# Patient Record
Sex: Male | Born: 1985 | Race: White | Hispanic: No | Marital: Married | Smoking: Current every day smoker
Health system: Southern US, Community
[De-identification: ages and names within clinical notes are randomized; demographics above are authoritative.]

## PROBLEM LIST (undated history)

## (undated) DIAGNOSIS — F419 Anxiety disorder, unspecified: Secondary | ICD-10-CM

## (undated) DIAGNOSIS — F429 Obsessive-compulsive disorder, unspecified: Secondary | ICD-10-CM

## (undated) HISTORY — PX: BACK SURGERY: SHX140

## (undated) HISTORY — DX: Obsessive-compulsive disorder, unspecified: F42.9

## (undated) HISTORY — DX: Anxiety disorder, unspecified: F41.9

---
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
## 2021-09-27 ENCOUNTER — Encounter: Payer: Self-pay | Admitting: Family Medicine

## 2021-09-27 ENCOUNTER — Other Ambulatory Visit: Payer: Self-pay

## 2021-09-27 ENCOUNTER — Ambulatory Visit (INDEPENDENT_AMBULATORY_CARE_PROVIDER_SITE_OTHER): Admitting: Family Medicine

## 2021-09-27 VITALS — BP 120/73 | HR 90 | Temp 99.1°F | Ht 67.0 in | Wt 273.0 lb

## 2021-09-27 DIAGNOSIS — Z23 Encounter for immunization: Secondary | ICD-10-CM | POA: Diagnosis not present

## 2021-09-27 DIAGNOSIS — F17200 Nicotine dependence, unspecified, uncomplicated: Secondary | ICD-10-CM | POA: Diagnosis not present

## 2021-09-27 DIAGNOSIS — Z0000 Encounter for general adult medical examination without abnormal findings: Secondary | ICD-10-CM

## 2021-09-27 DIAGNOSIS — G4452 New daily persistent headache (NDPH): Secondary | ICD-10-CM | POA: Diagnosis not present

## 2021-09-27 DIAGNOSIS — Z0001 Encounter for general adult medical examination with abnormal findings: Secondary | ICD-10-CM

## 2021-09-27 DIAGNOSIS — Z716 Tobacco abuse counseling: Secondary | ICD-10-CM | POA: Diagnosis not present

## 2021-09-27 LAB — URINALYSIS
Bilirubin, UA: NEGATIVE
Glucose, UA: NEGATIVE
Ketones, UA: NEGATIVE
Leukocytes,UA: NEGATIVE
Nitrite, UA: NEGATIVE
RBC, UA: NEGATIVE
Specific Gravity, UA: 1.02 (ref 1.005–1.030)
Urobilinogen, Ur: 1 mg/dL (ref 0.2–1.0)
pH, UA: 7 (ref 5.0–7.5)

## 2021-09-27 MED ORDER — TOPIRAMATE 25 MG PO TABS: ORAL_TABLET | ORAL | 0 refills | Status: DC
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
## 2021-09-28 LAB — CMP14+EGFR
ALT: 69 IU/L — ABNORMAL HIGH (ref 0–44)
AST: 37 IU/L (ref 0–40)
Albumin/Globulin Ratio: 1.8 (ref 1.2–2.2)
Albumin: 4.7 g/dL (ref 4.0–5.0)
Alkaline Phosphatase: 81 IU/L (ref 44–121)
BUN/Creatinine Ratio: 11 (ref 9–20)
BUN: 13 mg/dL (ref 6–20)
Bilirubin Total: 0.4 mg/dL (ref 0.0–1.2)
CO2: 23 mmol/L (ref 20–29)
Calcium: 9.6 mg/dL (ref 8.7–10.2)
Chloride: 105 mmol/L (ref 96–106)
Creatinine, Ser: 1.19 mg/dL (ref 0.76–1.27)
Globulin, Total: 2.6 g/dL (ref 1.5–4.5)
Glucose: 93 mg/dL (ref 70–99)
Potassium: 4.8 mmol/L (ref 3.5–5.2)
Sodium: 141 mmol/L (ref 134–144)
Total Protein: 7.3 g/dL (ref 6.0–8.5)
eGFR: 83 mL/min/{1.73_m2} (ref >59)

## 2021-09-28 LAB — CBC WITH DIFFERENTIAL/PLATELET
Basophils Absolute: 0.1 10*3/uL (ref 0.0–0.2)
Basos: 1 %
EOS (ABSOLUTE): 0.2 10*3/uL (ref 0.0–0.4)
Eos: 2 %
Hematocrit: 40.8 % (ref 37.5–51.0)
Hemoglobin: 13.7 g/dL (ref 13.0–17.7)
Immature Grans (Abs): 0 10*3/uL (ref 0.0–0.1)
Immature Granulocytes: 0 %
Lymphocytes Absolute: 3.5 10*3/uL — ABNORMAL HIGH (ref 0.7–3.1)
Lymphs: 37 %
MCH: 28.7 pg (ref 26.6–33.0)
MCHC: 33.6 g/dL (ref 31.5–35.7)
MCV: 86 fL (ref 79–97)
Monocytes Absolute: 0.7 10*3/uL (ref 0.1–0.9)
Monocytes: 7 %
Neutrophils Absolute: 5 10*3/uL (ref 1.4–7.0)
Neutrophils: 53 %
Platelets: 326 10*3/uL (ref 150–450)
RBC: 4.77 x10E6/uL (ref 4.14–5.80)
RDW: 12.9 % (ref 11.6–15.4)
WBC: 9.5 10*3/uL (ref 3.4–10.8)

## 2021-09-28 LAB — LIPID PANEL
Chol/HDL Ratio: 6.6 ratio — ABNORMAL HIGH (ref 0.0–5.0)
Cholesterol, Total: 210 mg/dL — ABNORMAL HIGH (ref 100–199)
HDL: 32 mg/dL — ABNORMAL LOW (ref >39)
LDL Chol Calc (NIH): 142 mg/dL — ABNORMAL HIGH (ref 0–99)
Triglycerides: 200 mg/dL — ABNORMAL HIGH (ref 0–149)
VLDL Cholesterol Cal: 36 mg/dL (ref 5–40)

## 2021-09-28 LAB — VITAMIN D 25 HYDROXY (VIT D DEFICIENCY, FRACTURES): Vit D, 25-Hydroxy: 21.6 ng/mL — ABNORMAL LOW (ref 30.0–100.0)

## 2021-11-01 ENCOUNTER — Encounter: Payer: Self-pay | Admitting: Family Medicine

## 2021-11-01 ENCOUNTER — Ambulatory Visit (INDEPENDENT_AMBULATORY_CARE_PROVIDER_SITE_OTHER): Admitting: Family Medicine

## 2021-11-01 VITALS — HR 83 | Temp 97.9°F | Ht 67.0 in | Wt 268.6 lb

## 2021-11-01 DIAGNOSIS — G43711 Chronic migraine without aura, intractable, with status migrainosus: Secondary | ICD-10-CM | POA: Diagnosis not present

## 2021-11-01 DIAGNOSIS — F411 Generalized anxiety disorder: Secondary | ICD-10-CM

## 2021-11-01 MED ORDER — TOPIRAMATE 100 MG PO TABS: 100.0000 mg | ORAL_TABLET | Freq: Every day | ORAL | 3 refills | Status: DC

## 2021-11-01 MED ORDER — HYDROXYZINE PAMOATE 25 MG PO CAPS: 25.0000 mg | ORAL_CAPSULE | Freq: Four times a day (QID) | ORAL | 3 refills | Status: DC | PRN

## 2021-11-01 NOTE — Progress Notes (Signed)
Subjective:  Patient ID: Tanner Johnson, male    DOB: July 24, 1986  Age: 35 y.o. MRN: [id number]  CC: Follow-up   HPI recheck of Tanner Johnson presents for recheck of his migraine.  He is having fewer now.  Only once since titrating the topiramate.  He is now taking it for a day.  He is not having side effects that he is noticed.  Particularly no dizzinessOr drowsiness. Patient also reports a moderate amount of anxiety.  This comes in discrete attacks.  He did like to have something that he does not have to take every day for a trial first before taking a daily medicine.  Anxiety score on GAD-7 noted below and reviewed with the patient.   Depression screen [facility] 2/9 11/01/2021 11/01/2021 09/27/2021  Decreased Interest 1 0 1  Down, Depressed, Hopeless 1 0 0  PHQ - 2 Score 2 0 1  Altered sleeping 1 - 0  Tired, decreased energy 1 - 1  Change in appetite 1 - 0  Feeling bad or failure about yourself  0 - 0  Trouble concentrating 0 - 0  Moving slowly or fidgety/restless 1 - 1  Suicidal thoughts 0 - 0  PHQ-9 Score 6 - 3  Difficult doing work/chores Somewhat difficult - Somewhat difficult   GAD 7 : Generalized Anxiety Score 11/01/2021 09/27/2021  Nervous, Anxious, on Edge 2 3  Control/stop worrying 2 1  Worry too much - different things 2 3  Trouble relaxing 2 3  Restless 2 1  Easily annoyed or irritable 2 3  Afraid - awful might happen 1 1  Total GAD 7 Score 13 15  Anxiety Difficulty Somewhat difficult Somewhat difficult      History Tanner Johnson has a past medical history of Anxiety and OCD (obsessive compulsive disorder).   He has a past surgical history that includes Back surgery.   His family history includes Hyperlipidemia in his father; Hypertension in his father.He reports that he has been smoking cigarettes. He has a 7.50 pack-year smoking history. He does not have any smokeless tobacco history on file. He reports that he does not currently use alcohol. He reports that he does not  use drugs.    ROS Review of Systems  Constitutional:  Negative for fever.  Respiratory:  Negative for shortness of breath.   Cardiovascular:  Negative for chest pain.  Musculoskeletal:  Negative for arthralgias.  Skin:  Negative for rash.   Objective:  Pulse 83    Temp 97.9 F (36.6 C)    Ht 5\' 7"  (1.702 m)    Wt 268 lb 9.6 oz (121.8 kg)    SpO2 98%    BMI 42.07 kg/m   BP Readings from Last 3 Encounters:  09/27/21 120/73    Wt Readings from Last 3 Encounters:  11/01/21 268 lb 9.6 oz (121.8 kg)  09/27/21 273 lb (123.8 kg)     Physical Exam Vitals reviewed.  Constitutional:      Appearance: He is well-developed.  HENT:     Head: Normocephalic and atraumatic.     Right Ear: External ear normal.     Left Ear: External ear normal.     Mouth/Throat:     Pharynx: No oropharyngeal exudate or posterior oropharyngeal erythema.  Eyes:     Pupils: Pupils are equal, round, and reactive to light.  Cardiovascular:     Rate and Rhythm: Normal rate and regular rhythm.     Heart sounds: No murmur heard. Pulmonary:  Effort: No respiratory distress.     Breath sounds: Normal breath sounds.  Musculoskeletal:     Cervical back: Normal range of motion and neck supple.  Neurological:     Mental Status: He is alert and oriented to person, place, and time.      Assessment & Plan:   Tanner Johnson was seen today for follow-up.  Diagnoses and all orders for this visit:  GAD (generalized anxiety disorder)  Intractable chronic migraine without aura and with status migrainosus  Other orders -     Discontinue: topiramate (TOPAMAX) 100 MG tablet; Take 1 tablet (100 mg total) by mouth at bedtime. 1 nightly times 1 week.  Then 2 nightly for 1 week.  Then 3 nightly for 1 week.  Then 4 nightly. -     hydrOXYzine (VISTARIL) 25 MG capsule; Take 1 capsule (25 mg total) by mouth every 6 (six) hours as needed for anxiety. -     topiramate (TOPAMAX) 100 MG tablet; Take 1 tablet (100 mg total) by  mouth at bedtime.      I have discontinued Tanner Johnson. [name]'s topiramate. I have also changed his topiramate. Additionally, I am having him start on hydrOXYzine.  Allergies as of 11/01/2021       Reactions   Sulfa Antibiotics Hives        Medication List        Accurate as of November 01, 2021  5:45 PM. If you have any questions, ask your nurse or doctor.          hydrOXYzine 25 MG capsule Commonly known as: VISTARIL Take 1 capsule (25 mg total) by mouth every 6 (six) hours as needed for anxiety. Started by: Mechele Claude, MD   topiramate 100 MG tablet Commonly known as: TOPAMAX Take 1 tablet (100 mg total) by mouth at bedtime. What changed:  medication strength how much to take how to take this when to take this additional instructions Changed by: Mechele Claude, MD         Follow-up: Return in about 6 months (around 05/02/2022).  Mechele Claude, M.D.

## 2021-11-09 ENCOUNTER — Other Ambulatory Visit: Payer: Self-pay

## 2021-11-09 ENCOUNTER — Ambulatory Visit (HOSPITAL_COMMUNITY)
Admission: RE | Admit: 2021-11-09 | Discharge: 2021-11-09 | Disposition: A | Discharge status: Home / Self Care | Source: Ambulatory Visit | Attending: Family Medicine | Admitting: Family Medicine

## 2021-11-09 DIAGNOSIS — G4452 New daily persistent headache (NDPH): Secondary | ICD-10-CM | POA: Insufficient documentation

## 2021-11-15 ENCOUNTER — Other Ambulatory Visit: Payer: Self-pay | Admitting: Family Medicine

## 2021-11-15 DIAGNOSIS — E236 Other disorders of pituitary gland: Secondary | ICD-10-CM

## 2022-01-15 ENCOUNTER — Encounter: Payer: Self-pay | Admitting: Neurology

## 2022-01-15 ENCOUNTER — Ambulatory Visit (INDEPENDENT_AMBULATORY_CARE_PROVIDER_SITE_OTHER): Admitting: Neurology

## 2022-01-15 VITALS — BP 136/86 | HR 80 | Ht 67.0 in | Wt 264.0 lb

## 2022-01-15 DIAGNOSIS — R209 Unspecified disturbances of skin sensation: Secondary | ICD-10-CM | POA: Insufficient documentation

## 2022-01-15 DIAGNOSIS — G4700 Insomnia, unspecified: Secondary | ICD-10-CM | POA: Insufficient documentation

## 2022-01-15 DIAGNOSIS — G4733 Obstructive sleep apnea (adult) (pediatric): Secondary | ICD-10-CM | POA: Diagnosis not present

## 2022-01-15 DIAGNOSIS — R519 Headache, unspecified: Secondary | ICD-10-CM | POA: Insufficient documentation

## 2022-01-15 DIAGNOSIS — F17200 Nicotine dependence, unspecified, uncomplicated: Secondary | ICD-10-CM | POA: Insufficient documentation

## 2022-01-15 DIAGNOSIS — R030 Elevated blood-pressure reading, without diagnosis of hypertension: Secondary | ICD-10-CM | POA: Insufficient documentation

## 2022-01-15 DIAGNOSIS — H5210 Myopia, unspecified eye: Secondary | ICD-10-CM | POA: Insufficient documentation

## 2022-01-15 DIAGNOSIS — M4310 Spondylolisthesis, site unspecified: Secondary | ICD-10-CM | POA: Insufficient documentation

## 2022-01-15 DIAGNOSIS — M5137 Other intervertebral disc degeneration, lumbosacral region: Secondary | ICD-10-CM | POA: Insufficient documentation

## 2022-01-15 DIAGNOSIS — M5416 Radiculopathy, lumbar region: Secondary | ICD-10-CM | POA: Insufficient documentation

## 2022-01-15 DIAGNOSIS — R410 Disorientation, unspecified: Secondary | ICD-10-CM | POA: Diagnosis not present

## 2022-01-15 DIAGNOSIS — M549 Dorsalgia, unspecified: Secondary | ICD-10-CM | POA: Insufficient documentation

## 2022-01-15 DIAGNOSIS — M51379 Other intervertebral disc degeneration, lumbosacral region without mention of lumbar back pain or lower extremity pain: Secondary | ICD-10-CM | POA: Insufficient documentation

## 2022-01-15 DIAGNOSIS — M999 Biomechanical lesion, unspecified: Secondary | ICD-10-CM | POA: Insufficient documentation

## 2022-01-15 DIAGNOSIS — J329 Chronic sinusitis, unspecified: Secondary | ICD-10-CM | POA: Insufficient documentation

## 2022-01-15 DIAGNOSIS — F3289 Other specified depressive episodes: Secondary | ICD-10-CM | POA: Insufficient documentation

## 2022-01-15 DIAGNOSIS — M25529 Pain in unspecified elbow: Secondary | ICD-10-CM | POA: Insufficient documentation

## 2022-01-15 DIAGNOSIS — M5126 Other intervertebral disc displacement, lumbar region: Secondary | ICD-10-CM | POA: Insufficient documentation

## 2022-01-15 DIAGNOSIS — F4320 Adjustment disorder, unspecified: Secondary | ICD-10-CM | POA: Insufficient documentation

## 2022-01-15 NOTE — Progress Notes (Signed)
Chief Complaint  Patient presents with   New Patient (Initial Visit)    Rm 15. Alone. NP internal referral for Empty sella turcica. Pt reports during the past month around 7PM he suffers from extreme exhaustion and has to take a nap, if he does not he will be fully awake until 3AM. Would like to know if it is related to pituitary issues.      ASSESSMENT AND PLAN  Tanner Johnson is a 36 y.o. male   Chronic migraine headaches At risk for obstructive sleep apnea  Empty sella on CT of head, not MRI candidate due to spinal cord stimulator.  Pituitary Hormone test  Refer to sleep study  Emphasized importance of weight loss, healthy sleep habit  Aleve as needed for headache     DIAGNOSTIC DATA (LABS, IMAGING, TESTING) - I reviewed patient records, labs, notes, testing and imaging myself where available.   MEDICAL HISTORY:  Tanner Johnson, is a 59 -year-old male seen in request by his primary care doctor Tanner Johnson, for evaluation of extreme exhaustion, sleepiness, initial evaluation was on January 15, 2021  I reviewed and summarized the referring note. Obesity,  Lumbar decompression surgery.  He used to serve in [employer], including heavy lifting, had right lumbar radiculopathy, decompression surgery did help him, but continued to have intermittent low back pain, radiating pain to right lower extremity, he used to take regular high dose of Percocet 10/325 mg 4 times a day, now was able to successfully wean off narcotics,  He has frequent occipital area headaches, seems to more frequent at morning time when he first woke up, he was put on Topamax 100 mg at nighttime, has helped his headache  He works as an Merchandiser, retail, sedentary lifestyle, sometimes spending long hours tried resolving a problem, he reported a history of obsessive-compulsive disorder, if he put his mind on something, he tends to spend hours on the task, sometimes go to bed 2 to 3 AM, has to get up at 7 AM,  over by the afternoon, he felt exhausted," Sickly tired", he has to take a nap to re power himself,  He does has obesity, snoring occasionally  PHYSICAL EXAM:   Vitals:   01/15/22 0806  BP: 136/86  Pulse: 80  Weight: 264 lb (119.7 kg)  Height: 5\' 7"  (1.702 m)   Not recorded     Body mass index is 41.35 kg/m.  PHYSICAL EXAMNIATION:  Gen: NAD, conversant, well nourised, well groomed                     Cardiovascular: Regular rate rhythm, no peripheral edema, warm, nontender. Eyes: Conjunctivae clear without exudates or hemorrhage Neck: Supple, no carotid bruits. Pulmonary: Clear to auscultation bilaterally   NEUROLOGICAL EXAM:  MENTAL STATUS: obese Speech:    Speech is normal; fluent and spontaneous with normal comprehension.  Cognition:     Orientation to time, place and person     Normal recent and remote memory     Normal Attention span and concentration     Normal Language, naming, repeating,spontaneous speech     Fund of knowledge   CRANIAL NERVES: CN II: Visual fields are full to confrontation. Pupils are round equal and briskly reactive to light. CN III, IV, VI: extraocular movement are normal. No ptosis. CN V: Facial sensation is intact to light touch CN VII: Face is symmetric with normal eye closure  CN VIII: Hearing is normal to causal conversation. CN  IX, X: Phonation is normal. CN XI: Head turning and shoulder shrug are intact CN XII: Narrow oropharyngeal space  MOTOR: There is no pronator drift of out-stretched arms. Muscle bulk and tone are normal. Muscle strength is normal.  REFLEXES: Reflexes are 2+ and symmetric at the biceps, triceps, knees, and ankles. Plantar responses are flexor.  SENSORY: Intact to light touch, pinprick and vibratory sensation are intact in fingers and toes.  COORDINATION: There is no trunk or limb dysmetria noted.  GAIT/STANCE: Posture is normal. Gait is steady with normal steps, base, arm swing, and turning. Heel  and toe walking are normal. Tandem gait is normal.  Romberg is absent.  REVIEW OF SYSTEMS:  Full 14 system review of systems performed and notable only for as above All other review of systems were negative.   ALLERGIES: Allergies  Allergen Reactions   Sulfa Antibiotics Hives    HOME MEDICATIONS: Current Outpatient Medications  Medication Sig Dispense Refill   hydrOXYzine (VISTARIL) 25 MG capsule Take 1 capsule (25 mg total) by mouth every 6 (six) hours as needed for anxiety. 50 capsule 3   topiramate (TOPAMAX) 100 MG tablet Take 1 tablet (100 mg total) by mouth at bedtime. 90 tablet 3   No current facility-administered medications for this visit.    PAST MEDICAL HISTORY: Past Medical History:  Diagnosis Date   Anxiety    OCD (obsessive compulsive disorder)     PAST SURGICAL HISTORY: Past Surgical History:  Procedure Laterality Date   BACK SURGERY     x 3    FAMILY HISTORY: Family History  Problem Relation Age of Onset   Hyperlipidemia Father    Hypertension Father     SOCIAL HISTORY: Social History   Socioeconomic History   Marital status: Married    Spouse name: Public librarian   Number of children: 2   Years of education: Not on file   Highest education level: Not on file  Occupational History   Not on file  Tobacco Use   Smoking status: Every Day    Packs/day: 0.50    Years: 15.00    Pack years: 7.50    Types: Cigarettes   Smokeless tobacco: Not on file  Vaping Use   Vaping Use: Every day   Substances: Nicotine  Substance and Sexual Activity   Alcohol use: Not Currently    Comment: rare   Drug use: Never   Sexual activity: Not on file  Other Topics Concern   Not on file  Social History Narrative   Not on file   Social Determinants of Health   Financial Resource Strain: Not on file  Food Insecurity: Not on file  Transportation Needs: Not on file  Physical Activity: Not on file  Stress: Not on file  Social Connections: Not on file  Intimate  Partner Violence: Not on file      Tanner Johnson, M.D. Ph.D.  [facility] [address] [place], [place] [address] Ph: [phone] Fax: [phone]  CC:  Tanner Claude, MD [address] [place],  [place] [address]  Tanner Claude, MD

## 2022-01-16 LAB — PROLACTIN: Prolactin: 12.5 ng/mL (ref 4.0–15.2)

## 2022-01-16 LAB — HGB A1C W/O EAG: Hgb A1c MFr Bld: 5.8 % — ABNORMAL HIGH (ref 4.8–5.6)

## 2022-01-16 LAB — TSH: TSH: 3.04 u[IU]/mL (ref 0.450–4.500)

## 2022-01-16 LAB — INSULIN-LIKE GROWTH FACTOR: Insulin-Like GF-1: 163 ng/mL (ref 95–290)

## 2022-01-16 LAB — ACTH: ACTH: 40.3 pg/mL (ref 7.2–63.3)

## 2022-01-17 ENCOUNTER — Telehealth: Payer: Self-pay | Admitting: *Deleted

## 2022-01-17 NOTE — Telephone Encounter (Signed)
Spoke with pt and informed of lab results. Encouraged pt to control diet and get moderate exercise. Pt verbalized understanding and expressed appreciation for the call. All questions answered. ?

## 2022-01-17 NOTE — Telephone Encounter (Signed)
-----   Message from Levert Feinstein, MD sent at 01/17/2022  1:30 PM EST ----- ?Please call patient, laboratory evaluation showed: ?Mild elevated A1c 5.8, indicating mild elevated glucose level over the past few months.  Rest of the laboratory evaluation showed no significant abnormality. ? ?Please encourage patient moderate exercise, diet control. ?

## 2022-03-14 ENCOUNTER — Ambulatory Visit (INDEPENDENT_AMBULATORY_CARE_PROVIDER_SITE_OTHER): Admitting: Neurology

## 2022-03-14 ENCOUNTER — Encounter: Payer: Self-pay | Admitting: Neurology

## 2022-03-14 VITALS — BP 157/108 | HR 82 | Ht 67.0 in | Wt 269.5 lb

## 2022-03-14 DIAGNOSIS — G4719 Other hypersomnia: Secondary | ICD-10-CM

## 2022-03-14 DIAGNOSIS — Z6841 Body Mass Index (BMI) 40.0 and over, adult: Secondary | ICD-10-CM

## 2022-03-14 DIAGNOSIS — R519 Headache, unspecified: Secondary | ICD-10-CM | POA: Diagnosis not present

## 2022-03-14 DIAGNOSIS — G43711 Chronic migraine without aura, intractable, with status migrainosus: Secondary | ICD-10-CM | POA: Diagnosis not present

## 2022-03-14 DIAGNOSIS — E662 Morbid (severe) obesity with alveolar hypoventilation: Secondary | ICD-10-CM

## 2022-03-14 DIAGNOSIS — R5383 Other fatigue: Secondary | ICD-10-CM

## 2022-03-14 DIAGNOSIS — Z720 Tobacco use: Secondary | ICD-10-CM

## 2022-03-14 MED ORDER — MODAFINIL 200 MG PO TABS: 200.0000 mg | ORAL_TABLET | Freq: Every day | ORAL | 5 refills | Status: DC

## 2022-03-14 NOTE — Patient Instructions (Signed)
Modafinil Tablets ?What is this medication? ?MODAFINIL (moe DAF i nil) treats sleep disorders, such as narcolepsy, obstructive sleep apnea, and shift work disorder. It works by promoting wakefulness. It belongs to a group of medications called stimulants. ?This medicine may be used for other purposes; ask your health care provider or pharmacist if you have questions. ?COMMON BRAND NAME(S): Provigil ?What should I tell my care team before I take this medication? ?They need to know if you have any of these conditions: ?Kidney disease ?Liver disease ?Mental health conditions ?An unusual or allergic reaction to modafinil, other medications, foods, dyes, or preservatives ?Pregnant or trying to get pregnant ?Breast-feeding ?How should I use this medication? ?Take this medication by mouth with water. Take it as directed on the prescription label at the same time every day. You can take it with or without food. If it upsets your stomach, take it with food. Keep taking it unless your care team tells you to stop. ?A special MedGuide will be given to you by the pharmacist with each prescription and refill. Be sure to read this information carefully each time. ?Talk to your care team about the use of this medication in children. Special care may be needed. ?Overdosage: If you think you have taken too much of this medicine contact a poison control center or emergency room at once. ?NOTE: This medicine is only for you. Do not share this medicine with others. ?What if I miss a dose? ?If you miss a dose, take it as soon as you can. If it is almost time for your next dose, take only that dose. Do not take double or extra doses. ?What may interact with this medication? ?Do not take this medication with any of the following: ?Amphetamine or dextroamphetamine ?Dexmethylphenidate or methylphenidate ?MAOIs, such as Marplan, Nardil, and Parnate ?Pemoline ?Procarbazine ?This medication may also interact with the following: ?Antifungal  medications, such as itraconazole or ketoconazole ?Barbiturates, such as phenobarbital ?Carbamazepine ?Cyclosporine ?Diazepam ?Estrogen or progestin hormones ?Medications for mental health conditions ?Phenytoin ?Propranolol ?Triazolam ?Warfarin ?This list may not describe all possible interactions. Give your health care provider a list of all the medicines, herbs, non-prescription drugs, or dietary supplements you use. Also tell them if you smoke, drink alcohol, or use illegal drugs. Some items may interact with your medicine. ?What should I watch for while using this medication? ?Visit your care team for regular checks on your progress. It may be some time before you see the benefit from this medication. ?This medication may affect your coordination, reaction time, or judgment. It may also hide signs that you are tired. This medication will not eliminate your abnormal tendency to fall asleep and is not a replacement for sleep. Do not drive or operate machinery until you know how this medication affects you. Sit up or stand slowly to reduce the risk of dizzy or fainting spells. Drinking alcohol with this medication can increase the risk of these side effects. ?This medication may cause serious skin reactions. They can happen weeks to months after starting the medication. Contact your care team right away if you notice fevers or flu-like symptoms with a rash. The rash may be red or purple and then turn into blisters or peeling of the skin. You may also notice a red rash with swelling of the face, lips, or lymph nodes in your neck or under your arms. ?Estrogen and progestin hormones may not work as well while you are taking this medication. If you are using these hormones   for contraception, talk to your care team about using a second type of contraception. A barrier contraceptive, such as a condom or diaphragm, is recommended. ?It is unknown if the effects of this medication will be increased by the use of caffeine.  Caffeine is found in many foods, beverages, and medications. Ask your care team if you should limit or change your intake of caffeine-containing products while on this medication. ?What side effects may I notice from receiving this medication? ?Side effects that you should report to your care team as soon as possible: ?Allergic reactions or angioedema--skin rash, itching or hives, swelling of the face, eyes, lips, tongue, arms, or legs, trouble swallowing or breathing ?Increase in blood pressure ?Mood and behavior changes--anxiety, nervousness, confusion, hallucinations, irritability, hostility, thoughts of suicide or self-harm, worsening mood, feelings of depression ?Rash, fever, and swollen lymph nodes ?Redness, blistering, peeling, or loosening of the skin, including inside the mouth ?Side effects that usually do not require medical attention (report to your care team if they continue or are bothersome): ?Anxiety, nervousness ?Dizziness ?Headache ?Nausea ?Trouble sleeping ?This list may not describe all possible side effects. Call your doctor for medical advice about side effects. You may report side effects to FDA at 1-800-FDA-1088. ?Where should I keep my medication? ?Keep out of the reach of children and pets. This medication can be abused. Keep it in a safe place to protect it from theft. Do not share it with anyone. It is only for you. Selling or giving away this medication is dangerous and against the law. ?Store at room temperature between 20 and 25 degrees C (68 and 77 degrees F). Get rid of any unused medication after the expiration date. ?This medication may cause harm and death if it is taken by other adults, children, or pets. It is important to get rid of the medication as soon as you no longer need it or it is expired. You can do this in two ways: ?Take the medication to a medication take-back program. Check with your pharmacy or law enforcement to find a location. ?If you cannot return the  medication, check the label or package insert to see if the medication should be thrown out in the garbage or flushed down the toilet. If you are not sure, ask your care team. If it is safe to put it in the trash, take the medication out of the container. Mix the medication with cat litter, dirt, coffee grounds, or other unwanted substance. Seal the mixture in a bag or container. Put it in the trash. ?NOTE: This sheet is a summary. It may not cover all possible information. If you have questions about this medicine, talk to your doctor, pharmacist, or health care provider. ?? 2023 Elsevier/Gold Standard (2021-12-14 00:00:00) ? ?

## 2022-03-14 NOTE — Addendum Note (Signed)
Addended by: Melvyn Novas on: 03/14/2022 11:51 AM ? ? Modules accepted: Orders ? ?

## 2022-03-14 NOTE — Progress Notes (Addendum)
? ? ?SLEEP MEDICINE CLINIC ?  ? ?Provider:  Larey Seat, MD  ?Primary Care Physician:  Claretta Fraise, MD ?[place] [address]  ? ?  ?Referring Provider: Marcial Pacas, Md ?[address] ?[address] ?[place],  [place] [address]  ?  ?  ?    ?Chief Complaint according to patient   ?Patient presents with:  ?  ? New Patient (Initial Visit)  ?   Marland Kitchen Pt referred by Dr. [name] for at risk for OSA due to obesity, snoring, and AM headache. Pt did have a CPAP from [place] 4 years. Pt d/c using due to not getting correct supplies for continued used. Has 3 C of coffee a day to stay awake.   ?  ?  ?HISTORY OF PRESENT ILLNESS:  ?Tanner Johnson is a 36 y.o. Caucasian male patient seen here as a referral on 03/14/2022 from Dr. [name]  for a transfer of sleep apnea care on 03-14-2022. ?.  ?Chief concern according to patient :  " I [facility] and was placed on CPAP in 2014 [place], [place], .My machine had an SD chip and I couldn't get supplies for reasons of not having documented enough time of use- but I used the machine every day for 4 or more hours, and finally gave up" Untreated OSA for the last 4-5 years- quit CPAP pre pandemic.  ?Sleep got worse since- non restorative, very sleepy all day, fatigued- I wake up not refreshed. Waking  with headaches, and I am being woken by headaches - plus occipital pain and allergies with sinusitis / rhinitis have recently kicked in again."  ?  ? Tanner Johnson   has a poorly documented past medical history: no mentioning of OSA, Obesity, allergic breathing, HTN,  tobacco use, Hypercholesterolemia, but of Anxiety and OCD (obsessive compulsive disorder). ?  ?The patient had the first sleep study in the year 2016  at the [place], [employer] . he was about 180 pounds then and is 270 pounds now.  ?Sleep relevant medical history: Sleep walking in childhood,  hx of recurrent Tonsillitis, no ENT surgeries.  ?Low spine surgery.  ?  ? Family medical /sleep history: father  with OSA, HTN as well, Mother  healthy.  ?Older brother is healthy.  ?  ?Social history:  Patient is working in Engineer, technical sales , Geneticist, molecular-  Data processing manager and lives in a household with spouse and 34 year-old and 39 month old sons.   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 Pets are not  present. ?Tobacco use- 1/2 pp week.   ?ETOH use 1-2 a month,  ?Caffeine intake in form of Coffee( 2 cups in Am and one in PM )  ?Soda( /) Tea ( rarely) . Regular exercise in form of walking .   ?  ?Sleep habits are as follows: The patient's dinner time is between 5.30-6.30 PM. The patient goes to bed at 12 PM or later -and continues to sleep for 4-8 hours, doesn't wake for bathroom breaks. He sleeps deep.    ?The preferred sleep position is the right side, with the support of 1 pillow.  ?Dreams are reportedly rare.  ?  6.30 AM is the usual rise time. The patient wakes up with multiple  alarms 6-6.30 AM   ?He reports not feeling refreshed or restored in AM, feeling groggy, with symptoms such as dry mouth, dull morning headaches, and feeling dehydrated , having residual  fatigue. ? Naps are taken infrequently, lack of opportunity-lasting from 45 to 60 minutes and  not always more refreshing  than nocturnal sleep.  ?  ?Review of Systems: ?Out of a complete 14 system review, the patient complains of only the following symptoms, and all other reviewed systems are negative.:  ?Fatigue, sleepiness , snoring, fragmented sleep,  ?Insomnia 2 times a month - when OCD flairs up.   ?  ?How likely are you to doze in the following situations: ?0 = not likely, 1 = slight chance, 2 = moderate chance, 3 = high chance ?  ?Sitting and Reading? 3 ?Watching Television? 3 ?Sitting inactive in a public place (theater or meeting)? 2 ZOOM MEETINGS 3 plus.  ?As a passenger in a car for an hour without a break?3 ?Lying down in the afternoon when circumstances permit?3 ?Sitting and talking to someone?0 ?Sitting quietly after lunch without alcohol?3 ?In a car, while  stopped for a few minutes in traffic?1 ?  ?Total = 18/ 24 points  ? FSS endorsed at 51/ 63 points.  ? ?Social History  ? ?Socioeconomic History  ? Marital status: Married  ?  Spouse name: Ubaldo Glassing  ? Number of children: 2  ? Years of education: Not on file  ? Highest education level: Bachelor's degree (e.g., BA, AB, BS)  ?Occupational History  ? Not on file  ?Tobacco Use  ? Smoking status: Every Day  ?  Packs/day: 0.50  ?  Years: 15.00  ?  Pack years: 7.50  ?  Types: Cigarettes  ? Smokeless tobacco: Not on file  ?Vaping Use  ? Vaping Use: Every day  ? Substances: Nicotine  ?Substance and Sexual Activity  ? Alcohol use: Not Currently  ?  Comment: rare  ? Drug use: Never  ? Sexual activity: Not on file  ?Other Topics Concern  ? Not on file  ?Social History Narrative  ? Lives w wife and 2 boys  ? R handed  ? Caffeine: 3 C of coffee a day  ? ?Social Determinants of Health  ? ?Financial Resource Strain: Not on file  ?Food Insecurity: Not on file  ?Transportation Needs: Not on file  ?Physical Activity: Not on file  ?Stress: Not on file  ?Social Connections: Not on file  ? ? ?Family History  ?Problem Relation Age of Onset  ? Hyperlipidemia Father   ? Hypertension Father   ? ? ?Past Medical History:  ?Diagnosis Date  ? Anxiety   ? OCD (obsessive compulsive disorder)   ? ? ?Past Surgical History:  ?Procedure Laterality Date  ? BACK SURGERY    ? x 3  ?  ? ?Current Outpatient Medications on File Prior to Visit  ?Medication Sig Dispense Refill  ? acetaminophen (TYLENOL) 500 MG tablet Take 1,000 mg by mouth as needed. Back pn    ? cetirizine (ZYRTEC) 10 MG tablet Take 10 mg by mouth at bedtime.    ? loratadine (CLARITIN) 10 MG tablet Take 10 mg by mouth in the morning.    ? topiramate (TOPAMAX) 100 MG tablet Take 1 tablet (100 mg total) by mouth at bedtime. 90 tablet 3  ? ?No current facility-administered medications on file prior to visit.  ? ? ?Allergies  ?Allergen Reactions  ? Sulfa Antibiotics Hives  ? ? ?Physical  exam: ? ?Today's Vitals  ? 03/14/22 1043  ?BP: (!) 157/108  ?Pulse: 82  ?Weight: 269 lb 8 oz (122.2 kg)  ?Height: 5\' 7"  (1.702 m)  ? ?Body mass index  is 42.21 kg/m?.  ? ?Wt Readings from Last 3 Encounters:  ?03/14/22 269 lb 8 oz (122.2 kg)  ?01/15/22 264 lb (119.7 kg)  ?11/01/21 268 lb 9.6 oz (121.8 kg)  ?  ? ?Ht Readings from Last 3 Encounters:  ?03/14/22 5\' 7"  (1.702 m)  ?01/15/22 5\' 7"  (1.702 m)  ?11/01/21 5\' 7"  (1.702 m)  ?  ?  ?General: The patient is awake, alert and appears not in acute distress. The patient is well groomed. ?Head: Normocephalic, atraumatic. Neck is supple. Mallampati 3 plus ,  ?neck circumference:20.5 inches .  ?Nasal airflow barely patent.   ?Retrognathia is not seen.  ?Dental status: crowded dentition.  ?Cardiovascular:  Regular rate and cardiac rhythm by pulse,  without distended neck veins. ?Respiratory: Lungs are clear to auscultation.  ?Skin:  Without evidence of ankle edema, or rash. ?Trunk: The patient's posture is erect. ?  ?Neurologic exam : ?The patient is awake and alert, oriented to place and time.   ?Memory subjective described as intact.  ?Attention span & concentration ability appears normal.  ?Speech is fluent,  without  dysarthria, dysphonia or aphasia.  ?Mood and affect are appropriate. ?  ?Cranial nerves: no loss of smell or taste reported - covid 7 months ago, recovered quickly , was fully vaccinated  ?Pupils are equal and briskly reactive to light. Funduscopic exam deferred. Marland Kitchen  ?Extraocular movements in vertical and horizontal planes were intact and without nystagmus. Ptosis bilaterally-  No Diplopia. ?Visual fields by finger perimetry are intact. ?Hearing was intact to soft voice and finger rubbing.   ? Facial sensation intact to fine touch. ? Facial motor strength is symmetric and tongue and uvula move midline.  ?Neck ROM : rotation, tilt and flexion extension were normal for age and shoulder shrug was symmetrical.  ?  ?Motor exam:  Symmetric bulk, tone and ROM.    ?Normal tone without cog wheeling, symmetric grip strength . ?Sensory:  Fine touch and vibration were normal.  ?Proprioception tested in the upper extremities was normal. ?Coordination: Rapid alternating movements in the fi

## 2022-03-20 ENCOUNTER — Telehealth: Payer: Self-pay

## 2022-03-20 NOTE — Telephone Encounter (Signed)
LVM for pt to call me back to schedule sleep study  

## 2022-03-26 ENCOUNTER — Ambulatory Visit (INDEPENDENT_AMBULATORY_CARE_PROVIDER_SITE_OTHER): Admitting: Neurology

## 2022-03-26 DIAGNOSIS — G4710 Hypersomnia, unspecified: Secondary | ICD-10-CM

## 2022-03-26 DIAGNOSIS — G4719 Other hypersomnia: Secondary | ICD-10-CM

## 2022-03-26 DIAGNOSIS — R519 Headache, unspecified: Secondary | ICD-10-CM

## 2022-03-26 DIAGNOSIS — R5383 Other fatigue: Secondary | ICD-10-CM

## 2022-03-26 DIAGNOSIS — Z720 Tobacco use: Secondary | ICD-10-CM

## 2022-03-26 DIAGNOSIS — G43711 Chronic migraine without aura, intractable, with status migrainosus: Secondary | ICD-10-CM

## 2022-03-26 DIAGNOSIS — E662 Morbid (severe) obesity with alveolar hypoventilation: Secondary | ICD-10-CM

## 2022-03-28 NOTE — Progress Notes (Signed)
?  ?  ?[facility] ?  ?HOME SLEEP TEST REPORT ( by Watch PAT)   ?STUDY DATE:  03-28-2022 ?  ?ORDERING CLINICIAN: Carmen Dohmeier, MD  ?REFERRING CLINICIAN: Dr Yan,MD ?  ?CLINICAL INFORMATION/HISTORY: Tanner Johnson is a 35 y.o. male   ?He presented to dr Yan with Chronic migraine headaches, Excessive daytime sleepiness, at risk for obstructive sleep apnea due to body mass, neck size , chronic pain history. ?            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 ?            Pituitary Hormone test was ordered/  Referral to sleep study ?            Emphasized importance of weight loss, healthy sleep habit ?            Aleve as needed for headache. ? ?Tanner Johnson is a 35 y.o. Caucasian male patient seen here as a referral on 03/14/2022 from Dr. Yan  for a transfer of sleep apnea care on 03-14-2022. ?Chief concern :  " I had a [facility] based Sleep [facility] and was placed on CPAP in 2014 [place], [place], .My machine had an SD chip and I couldn't get supplies for reasons of not having documented enough time of use- but I used the machine every day for 4 or more hours, and finally gave up"  ?Untreated OSA for the last 4-5 years- quit CPAP pre pandemic.  ?Sleep got worse since- non restorative, very sleepy all day, fatigued- I wake up not refreshed. Waking  with headaches, and I am being woken by headaches - plus occipital pain and allergies with sinusitis / rhinitis have recently kicked in again."  ?  ?Tanner Johnson has a poorly documented past medical history: no mentioning of OSA, Obesity, allergic breathing, HTN,  tobacco use, Hypercholesterolemia, but of Anxiety and OCD (obsessive compulsive disorder). ?  ?Epworth sleepiness score: 18/24. ?  ?BMI:42.2 kg/m? ?  ?Neck Circumference: 21" ?  ?FINDINGS: ?  ?Sleep Summary: ?  ?Total Recording Time (hours, min): Total recording time amounted to 7 hours and 2 minutes of which the total sleep time was calculated at 5 hours 50 minutes.  REM sleep  time was 26.4%.     ?  ?Respiratory Indices: ?  ?Calculated pAHI (per hour): 11.8/h which is a mild degree of sleep apnea                          ?  ?REM pAHI: 28/h                                              ?  ?NREM pAHI:    5.9/h                        ?  ?Positional AHI: The patient mostly slept in supine position for 212 minutes with an AHI of 15.2 followed by prone sleep with an AHI of 8.7 and right-sided lateral sleep with an AHI of 4/h. ? ?Snoring reached a mean volume of 52 dB and was present for almost 90% of total recorded time which is very severe.                                                ?  ?  Oxygen Saturation Statistics: ?O2 Saturation Range (%): Between a nadir of 89 nadir and a maximum of 99% saturation mean oxygen saturation was 95%.                                   ?O2 Saturation (minutes) <89%:     0 minutes    ?  ?Pulse Rate Statistics: ?  ?Pulse Mean (bpm):   74 bpm            ?  ?Pulse Range:    Between 51 and 98 bpm           ?  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  There was no associated hypoxia, heart rate was in normal variability range, and sleep was moderately fragmented.  Snoring was extraordinarily loud.  Supine sleep definitely was associated with a higher sleep apnea degree. ?  ?RECOMMENDATION: auto CPAP with a setting from 5-15 cm water, 2 cm EPR and interface of patient's choice, with heated humidification.  ?Interface of patient's choice ( with this level of snoring it will be a FFM , most likely). I recommend to avoid the supine sleep position if in anyway possible ( back surgery and pain may make this difficult). ? ?  ?INTERPRETING PHYSICIAN: ? ? Carmen Dohmeier, MD  ? ?Medical Director of [facility].  ? ? ? ? ? ? ? ? ? ? ? ? ? ? ? ? ? ? ? ? ?

## 2022-03-30 NOTE — Addendum Note (Signed)
Addended by: Melvyn Novas on: 03/30/2022 03:33 PM ? ? Modules accepted: Orders ? ?

## 2022-03-30 NOTE — Procedures (Signed)
?  ?  ?[facility] ?  ?HOME SLEEP TEST REPORT ( by Watch PAT)   ?STUDY DATE:  03-28-2022 ?  ?ORDERING CLINICIAN: Larey Seat, MD  ?REFERRING CLINICIAN: Dr [name] ?  ?CLINICAL INFORMATION/HISTORY: Tanner Johnson is a 36 y.o. male   ?He presented to dr [name] with Chronic migraine headaches, Excessive daytime sleepiness, at risk for obstructive sleep apnea due to body mass, neck size , chronic pain history. ?            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 ?            Pituitary Hormone test was ordered/  Referral to sleep study ?            Emphasized importance of weight loss, healthy sleep habit ?            Aleve as needed for headache. ? ?Tanner Johnson is a 36 y.o. Caucasian male patient seen here as a referral on 03/14/2022 from Dr. [name]  for a transfer of sleep apnea care on 03-14-2022. ?Chief concern :  " I had a [facility] based Sleep [facility] and was placed on CPAP in 2014 [place], [place], .My machine had an SD chip and I couldn't get supplies for reasons of not having documented enough time of use- but I used the machine every day for 4 or more hours, and finally gave up"  ?Untreated OSA for the last 4-5 years- quit CPAP pre pandemic.  ?Sleep got worse since- non restorative, very sleepy all day, fatigued- I wake up not refreshed. Waking  with headaches, and I am being woken by headaches - plus occipital pain and allergies with sinusitis / rhinitis have recently kicked in again."  ?  ?Tanner Johnson has a poorly documented past medical history: no mentioning of OSA, Obesity, allergic breathing, HTN,  tobacco use, Hypercholesterolemia, but of Anxiety and OCD (obsessive compulsive disorder). ?  ?Epworth sleepiness score: 18/24. ?  ?BMI:42.2 kg/m? ?  ?Neck Circumference: 21" ?  ?FINDINGS: ?  ?Sleep Summary: ?  ?Total Recording Time (hours, min): Total recording time amounted to 7 hours and 2 minutes of which the total sleep time was calculated at 5 hours 50 minutes.  REM sleep  time was 26.4%.     ?  ?Respiratory Indices: ?  ?Calculated pAHI (per hour): 11.8/h which is a mild degree of sleep apnea                          ?  ?REM pAHI: 28/h                                              ?  ?NREM pAHI:    5.9/h                        ?  ?Positional AHI: The patient mostly slept in supine position for 212 minutes with an AHI of 15.2 followed by prone sleep with an AHI of 8.7 and right-sided lateral sleep with an AHI of 4/h. ? ?Snoring reached a mean volume of 52 dB and was present for almost 90% of total recorded time which is very severe.                                                ?  ?  Oxygen Saturation Statistics: ?O2 Saturation Range (%): Between a nadir of 89 nadir and a maximum of 99% saturation mean oxygen saturation was 95%.                                   ?O2 Saturation (minutes) <89%:     0 minutes    ?  ?Pulse Rate Statistics: ?  ?Pulse Mean (bpm):   74 bpm            ?  ?Pulse Range:    Between 51 and 98 bpm           ?  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  There was no associated hypoxia, heart rate was in normal variability range, and sleep was moderately fragmented.  Snoring was extraordinarily loud.  Supine sleep definitely was associated with a higher sleep apnea degree. ?  ?RECOMMENDATION: auto CPAP with a setting from 5-15 cm water, 2 cm EPR and interface of patient's choice, with heated humidification.  ?Interface of patient's choice ( with this level of snoring it will be a FFM , most likely). I recommend to avoid the supine sleep position if in anyway possible ( back surgery and pain may make this difficult). ? ?  ?INTERPRETING PHYSICIAN: ? ? Larey Seat, MD  ? ?Medical Director of [facility].  ? ? ? ? ? ? ? ? ? ? ? ? ? ? ? ? ? ? ? ? ?

## 2022-04-02 ENCOUNTER — Telehealth: Payer: Self-pay

## 2022-04-02 NOTE — Telephone Encounter (Signed)
I called pt. I advised pt that Dr. Vickey Huger reviewed their sleep study results and found that pt has osa. Dr. Vickey Huger recommends that pt start an auto pap at home. I reviewed PAP compliance expectations with the pt. Pt is agreeable to starting an auto-PAP. I advised pt that an order will be sent to a DME, [facility], and [facility] will call the pt within about one week after they file with the pt's insurance. [facility] will show the pt how to use the machine, fit for masks, and troubleshoot the auto-PAP if needed. A follow up appt was made for insurance purposes with Amy, NP on 07/09/22 at 9:00am. Pt verbalized understanding to arrive 15 minutes early and bring their auto-PAP. A letter with all of this information in it will be mailed to the pt as a reminder. I verified with the pt that the address we have on file is correct. Pt verbalized understanding of results. Pt had no questions at this time but was encouraged to call back if questions arise. I have sent the order to [facility] and have received confirmation that they have received the order. ? ?

## 2022-04-02 NOTE — Telephone Encounter (Signed)
I called patient to discuss. No answer, left a message asking him to call us back.  

## 2022-04-02 NOTE — Telephone Encounter (Signed)
Pt has called Kristen, RN back. Please call 

## 2022-04-02 NOTE — Telephone Encounter (Signed)
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  There was no associated hypoxia, heart rate was in normal variability range, and sleep was moderately fragmented.  Snoring was extraordinarily loud.  Supine sleep definitely was associated with a higher sleep apnea degree. ?? ?RECOMMENDATION: auto CPAP with a setting from 5-15 cm water, 2 cm EPR and interface of patient's choice, with heated humidification.  ?Interface of patient's choice ( with this level of snoring it will be a FFM , most likely). I recommend to avoid the supine sleep position if in anyway possible ( back surgery and pain may make this difficult). ?? ?? ?INTERPRETING PHYSICIAN: ?? ? Melvyn Novas, MD  ?

## 2022-04-11 ENCOUNTER — Telehealth: Payer: Self-pay | Admitting: Neurology

## 2022-04-11 NOTE — Telephone Encounter (Signed)
Pt was scheduled for Initial CPAP visit on 07/03/22 Pt was informed  to bring machine and power cord to appt. DME: [facility] Phone: [phone] Fax: [phone] Equipment issued: Tanner Johnson 10 Auto on 04/09/22 Pt to be schedule between: 05/10/22-07/08/22

## 2022-05-02 ENCOUNTER — Ambulatory Visit (INDEPENDENT_AMBULATORY_CARE_PROVIDER_SITE_OTHER): Admitting: Family Medicine

## 2022-05-02 ENCOUNTER — Ambulatory Visit: Admitting: Family Medicine

## 2022-05-02 ENCOUNTER — Encounter: Payer: Self-pay | Admitting: Family Medicine

## 2022-05-02 VITALS — BP 119/74 | HR 90 | Temp 98.1°F | Ht 67.0 in | Wt 273.8 lb

## 2022-05-02 DIAGNOSIS — G4733 Obstructive sleep apnea (adult) (pediatric): Secondary | ICD-10-CM

## 2022-05-02 NOTE — Progress Notes (Signed)
Subjective:  Patient ID: Tanner Johnson, male    DOB: [birth year]  Age: 36 y.o. MRN: [id number]  CC: Medical Management of Chronic Issues   HPI Tanner Johnson presents for CPAP for OSA working great. Stopped the HA. Able to DC the topiramate. Missed one night and awoke with HA. CPAP helps him awaken in the AM refreshed. More aware.   Passed a kidney stone a few days ago. Was given 2 week course of tamsulosin.      05/02/2022    8:34 AM 11/01/2021   10:26 AM 11/01/2021   10:22 AM  Depression screen PHQ 2/9  Decreased Interest 0 1 0  Down, Depressed, Hopeless 0 1 0  PHQ - 2 Score 0 2 0  Altered sleeping  1   Tired, decreased energy  1   Change in appetite  1   Feeling bad or failure about yourself   0   Trouble concentrating  0   Moving slowly or fidgety/restless  1   Suicidal thoughts  0   PHQ-9 Score  6   Difficult doing work/chores  Somewhat difficult       11/01/2021   10:26 AM 09/27/2021   12:46 PM  GAD 7 : Generalized Anxiety Score  Nervous, Anxious, on Edge 2 3  Control/stop worrying 2 1  Worry too much - different things 2 3  Trouble relaxing 2 3  Restless 2 1  Easily annoyed or irritable 2 3  Afraid - awful might happen 1 1  Total GAD 7 Score 13 15  Anxiety Difficulty Somewhat difficult Somewhat difficult    Today his anxiety score on review is 0!  History Tanner Johnson has a past medical history of Anxiety and OCD (obsessive compulsive disorder).   He has a past surgical history that includes Back surgery.   His family history includes Hyperlipidemia in his father; Hypertension in his father.He reports that he has been smoking cigarettes. He has a 7.50 pack-year smoking history. He does not have any smokeless tobacco history on file. He reports that he does not currently use alcohol. He reports that he does not use drugs.    ROS Review of Systems  Constitutional:  Negative for fever.  Respiratory:  Negative for shortness of breath.   Cardiovascular:   Negative for chest pain.  Musculoskeletal:  Negative for arthralgias.  Skin:  Negative for rash.    Objective:  BP 119/74   Pulse 90   Temp 98.1 F (36.7 C)   Ht 5\' 7"  (1.702 m)   Wt 273 lb 12.8 oz (124.2 kg)   SpO2 97%   BMI 42.88 kg/m   BP Readings from Last 3 Encounters:  05/02/22 119/74  03/14/22 (!) 157/108  01/15/22 136/86    Wt Readings from Last 3 Encounters:  05/02/22 273 lb 12.8 oz (124.2 kg)  03/14/22 269 lb 8 oz (122.2 kg)  01/15/22 264 lb (119.7 kg)     Physical Exam Vitals reviewed.  Constitutional:      Appearance: He is well-developed.  HENT:     Head: Normocephalic and atraumatic.     Right Ear: External ear normal.     Left Ear: External ear normal.     Mouth/Throat:     Pharynx: No oropharyngeal exudate or posterior oropharyngeal erythema.  Eyes:     Pupils: Pupils are equal, round, and reactive to light.  Cardiovascular:     Rate and Rhythm: Normal rate and regular rhythm.     Heart  sounds: No murmur heard. Pulmonary:     Effort: No respiratory distress.     Breath sounds: Normal breath sounds.  Musculoskeletal:     Cervical back: Normal range of motion and neck supple.  Neurological:     Mental Status: He is alert and oriented to person, place, and time.       Assessment & Plan:   Anup was seen today for medical management of chronic issues.  Diagnoses and all orders for this visit:  Obstructive sleep apnea  Morbid obesity (HCC)       I have discontinued Oseph Imburgia. [name]'s topiramate and modafinil. I am also having him maintain his loratadine, cetirizine, acetaminophen, and tamsulosin.  Allergies as of 05/02/2022       Reactions   Sulfa Antibiotics Hives        Medication List        Accurate as of May 02, 2022 10:45 AM. If you have any questions, ask your nurse or doctor.          STOP taking these medications    modafinil 200 MG tablet Commonly known as: PROVIGIL Stopped by: Mechele Claude, MD    topiramate 100 MG tablet Commonly known as: TOPAMAX Stopped by: Mechele Claude, MD       TAKE these medications    cetirizine 10 MG tablet Commonly known as: ZYRTEC Take 10 mg by mouth at bedtime.   loratadine 10 MG tablet Commonly known as: CLARITIN Take 10 mg by mouth in the morning.   tamsulosin 0.4 MG Caps capsule Commonly known as: FLOMAX Take 0.4 mg by mouth daily.   TYLENOL 500 MG tablet Generic drug: acetaminophen Take 1,000 mg by mouth as needed. Back pn       Continue CPAP as ordered by neurology.  Since he is sleeping well he does not need to use the insomnia medication.  The better sleep has led to decreased anxiety so he does not need medication for that anymore.  He can discontinue the tamsulosin since his stone has been passed.  He should focus his efforts on weight loss.  We discussed a minimum of a pound a week up to a pound and a half a week if he will exercise regularly and control his diet.  Now that he has gotten treatment for his sleep apnea he should be able to lose weight much more readily.  Follow-up: Return in about 6 months (around 11/01/2022) for [facility] physical.  Mechele Claude, M.D.

## 2022-07-03 ENCOUNTER — Ambulatory Visit (INDEPENDENT_AMBULATORY_CARE_PROVIDER_SITE_OTHER): Admitting: Family Medicine

## 2022-07-03 ENCOUNTER — Encounter: Payer: Self-pay | Admitting: Family Medicine

## 2022-07-03 VITALS — BP 150/102 | HR 95 | Ht 67.0 in | Wt 285.0 lb

## 2022-07-03 DIAGNOSIS — G4733 Obstructive sleep apnea (adult) (pediatric): Secondary | ICD-10-CM

## 2022-07-03 NOTE — Progress Notes (Signed)
PATIENT: Tanner Johnson DOB: 08-31-1986  REASON FOR VISIT: follow up HISTORY FROM: patient  Chief Complaint  Patient presents with   Obstructive Sleep Apnea    Rm 1, alone. Here for initial CPAP f/u. Pt reports doing well. No longer waking up w headache and feels more well rested.      HISTORY OF PRESENT ILLNESS:  07/03/22 ALL:  Tanner Johnson is a 36 y.o. male here today for follow up for OSA on CPAP. He was seen in consult with Dr [name] 12/2021 for empty sella on CT and headaches. She referred him to Dr Brett Fairy for sleep eval and he was seen 02/2022. HST 03/2022 showed overall mild OSA with AHI of 11/hr with REM AHI 28/hr. He was started on AutoPAP. Since, he reports doing well. Headaches have significantly improved. He no longer wakes with headaches. He is feeling better rested. He doesn't feel he needs as much sleep. He is sleeping about 4 hours a night, on average. He is using Dream wear FFM.     HISTORY: (copied from Dr Dohmeier's previous note)  Tanner Johnson is a 36 y.o. Caucasian male patient seen here as a referral on 03/14/2022 from Dr. [name]  for a transfer of sleep apnea care on 03-14-2022. Marland Kitchen  Chief concern according to patient :  " [facility] and was placed on CPAP in 2014 [place], [place], .My machine had an SD chip and I couldn't get supplies for reasons of not having documented enough time of use- but I used the machine every day for 4 or more hours, and finally gave up" Untreated OSA for the last 4-5 years- quit CPAP pre pandemic.  Sleep got worse since- non restorative, very sleepy all day, fatigued- I wake up not refreshed. Waking  with headaches, and I am being woken by headaches - plus occipital pain and allergies with sinusitis / rhinitis have recently kicked in again."     Tanner Johnson   has a poorly documented past medical history: no mentioning of OSA, Obesity, allergic breathing, HTN,  tobacco use, Hypercholesterolemia, but of Anxiety and OCD  (obsessive compulsive disorder).   The patient had the first sleep study in the year 2016  at the [place] [employer] . he was about 180 pounds then and is 270 pounds now.  Sleep relevant medical history: Sleep walking in childhood,  hx of recurrent Tonsillitis, no ENT surgeries.  Low spine surgery.     Family medical /sleep history: father  with OSA, HTN as well, Mother healthy.  Older brother is healthy.    Social history:  Patient is working in Engineer, technical sales , Geneticist, molecular-  Data processing manager and lives in a household with spouse and 63 year-old and 68 month old sons.   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 Pets are not  present. Tobacco use- 1/2 pp week.   ETOH use 1-2 a month,  Caffeine intake in form of Coffee( 2 cups in Am and one in PM )  Soda( /) Tea ( rarely) . Regular exercise in form of walking .     Sleep habits are as follows: The patient's dinner time is between 5.30-6.30 PM. The patient goes to bed at 12 PM or later -and continues to sleep for 4-8 hours, doesn't wake for bathroom breaks. He sleeps deep.    The preferred sleep position is the right side, with the support of 1 pillow.  Dreams are reportedly rare.    6.30 AM is the usual rise time. The patient wakes up with multiple  alarms 6-6.30 AM   He reports not feeling refreshed or restored in AM, feeling groggy, with symptoms such as dry mouth, dull morning headaches, and feeling dehydrated , having residual fatigue.  Naps are taken infrequently, lack of opportunity-lasting from 45 to 60 minutes and  not always more refreshing  than nocturnal sleep.    REVIEW OF SYSTEMS: Out of a complete 14 system review of symptoms, the patient complains only of the following symptoms, back pain and all other reviewed systems are negative.  ESS: 6/24  ALLERGIES: Allergies  Allergen Reactions   Sulfa Antibiotics Hives    HOME MEDICATIONS: Outpatient Medications Prior to Visit  Medication Sig Dispense Refill    acetaminophen (TYLENOL) 500 MG tablet Take 1,000 mg by mouth as needed. Back pn     cetirizine (ZYRTEC) 10 MG tablet Take 10 mg by mouth at bedtime.     loratadine (CLARITIN) 10 MG tablet Take 10 mg by mouth in the morning.     tamsulosin (FLOMAX) 0.4 MG CAPS capsule Take 0.4 mg by mouth daily. (Patient not taking: Reported on 07/03/2022)     No facility-administered medications prior to visit.    PAST MEDICAL HISTORY: Past Medical History:  Diagnosis Date   Anxiety    OCD (obsessive compulsive disorder)     PAST SURGICAL HISTORY: Past Surgical History:  Procedure Laterality Date   BACK SURGERY     x 3    FAMILY HISTORY: Family History  Problem Relation Age of Onset   Hyperlipidemia Father    Hypertension Father     SOCIAL HISTORY: Social History   Socioeconomic History   Marital status: Married    Spouse name: [name]   Number of children: 2   Years of education: Not on file   Highest education level: Bachelor's degree (e.g., BA, AB, BS)  Occupational History   Not on file  Tobacco Use   Smoking status: Every Day    Packs/day: 0.50    Years: 15.00    Total pack years: 7.50    Types: Cigarettes   Smokeless tobacco: Not on file  Vaping Use   Vaping Use: Every day   Substances: Nicotine  Substance and Sexual Activity   Alcohol use: Not Currently    Comment: rare   Drug use: Never   Sexual activity: Not on file  Other Topics Concern   Not on file  Social History Narrative   Lives w wife and 2 boys   R handed   Caffeine: 3 C of coffee a day   Social Determinants of Corporate investment banker Strain: Not on file  Food Insecurity: Not on file  Transportation Needs: Not on file  Physical Activity: Not on file  Stress: Not on file  Social Connections: Not on file  Intimate Partner Violence: Not on file     PHYSICAL EXAM  Vitals:   07/03/22 1322  BP: (!) 150/102  Pulse: 95  Weight: 285 lb (129.3 kg)  Height: 5\' 7"  (1.702 m)   Body mass index is  44.64 kg/m.  Generalized: Well developed, in no acute distress  Cardiology: normal rate and rhythm, no murmur noted Respiratory: clear to auscultation bilaterally  Neurological examination  Mentation: Alert oriented to time, place, history taking. Follows all commands speech and language fluent Cranial nerve II-XII: Pupils were equal round reactive to light. Extraocular movements were  full, visual field were full  Motor: The motor testing reveals 5 over 5 strength of all 4 extremities. Good symmetric motor tone is noted throughout.  Gait and station: Gait is normal.    DIAGNOSTIC DATA (LABS, IMAGING, TESTING) - I reviewed patient records, labs, notes, testing and imaging myself where available.      No data to display           Lab Results  Component Value Date   WBC 9.5 09/27/2021   HGB 13.7 09/27/2021   HCT 40.8 09/27/2021   MCV 86 09/27/2021   PLT 326 09/27/2021      Component Value Date/Time   NA 141 09/27/2021 1154   K 4.8 09/27/2021 1154   CL 105 09/27/2021 1154   CO2 23 09/27/2021 1154   GLUCOSE 93 09/27/2021 1154   BUN 13 09/27/2021 1154   CREATININE 1.19 09/27/2021 1154   CALCIUM 9.6 09/27/2021 1154   PROT 7.3 09/27/2021 1154   ALBUMIN 4.7 09/27/2021 1154   AST 37 09/27/2021 1154   ALT 69 (H) 09/27/2021 1154   ALKPHOS 81 09/27/2021 1154   BILITOT 0.4 09/27/2021 1154   Lab Results  Component Value Date   CHOL 210 (H) 09/27/2021   HDL 32 (L) 09/27/2021   LDLCALC 142 (H) 09/27/2021   TRIG 200 (H) 09/27/2021   CHOLHDL 6.6 (H) 09/27/2021   Lab Results  Component Value Date   HGBA1C 5.8 (H) 01/15/2022   No results found for: "VITAMINB12" Lab Results  Component Value Date   TSH 3.040 01/15/2022     ASSESSMENT AND PLAN 36 y.o. year old male  has a past medical history of Anxiety and OCD (obsessive compulsive disorder). here with   No diagnosis found.    ALFIE RIDEAUX is doing well on CPAP therapy. Compliance report reveals excellent daily but  sub optimal four hour usage. I have discussed need for at least 6 hours of sleep every night. He was encouraged to continue using CPAP nightly and for greater than 4 hours each night. We will update supply orders as indicated. Risks of untreated sleep apnea review and education materials provided. Healthy lifestyle habits encouraged. He will follow up in 1 year, sooner if needed. He verbalizes understanding and agreement with this plan.    No orders of the defined types were placed in this encounter.    No orders of the defined types were placed in this encounter.     Shawnie Dapper, FNP-C 07/03/2022, 1:44 PM [facility] [address] [place], [place] [address] [phone]

## 2022-07-03 NOTE — Patient Instructions (Signed)
Please continue using your CPAP regularly. While your insurance requires that you use CPAP at least 4 hours each night on 70% of the nights, I recommend, that you not skip any nights and use it throughout the night if you can. Getting used to CPAP and staying with the treatment long term does take time and patience and discipline. Untreated obstructive sleep apnea when it is moderate to severe can have an adverse impact on cardiovascular health and raise her risk for heart disease, arrhythmias, hypertension, congestive heart failure, stroke and diabetes. Untreated obstructive sleep apnea causes sleep disruption, nonrestorative sleep, and sleep deprivation. This can have an impact on your day to day functioning and cause daytime sleepiness and impairment of cognitive function, memory loss, mood disturbance, and problems focussing. Using CPAP regularly can improve these symptoms.  Follow up 1 year  

## 2022-07-09 ENCOUNTER — Encounter: Admitting: Family Medicine

## 2022-07-23 ENCOUNTER — Encounter (HOSPITAL_COMMUNITY): Payer: Self-pay | Admitting: Emergency Medicine

## 2022-07-23 ENCOUNTER — Other Ambulatory Visit: Payer: Self-pay

## 2022-07-23 ENCOUNTER — Emergency Department (HOSPITAL_COMMUNITY)

## 2022-07-23 ENCOUNTER — Emergency Department (HOSPITAL_COMMUNITY)
Admission: EM | Admit: 2022-07-23 | Discharge: 2022-07-23 | Disposition: A | Discharge status: Home / Self Care | Attending: Emergency Medicine | Admitting: Emergency Medicine

## 2022-07-23 DIAGNOSIS — S29002A Unspecified injury of muscle and tendon of back wall of thorax, initial encounter: Secondary | ICD-10-CM | POA: Diagnosis present

## 2022-07-23 DIAGNOSIS — S29012A Strain of muscle and tendon of back wall of thorax, initial encounter: Secondary | ICD-10-CM | POA: Insufficient documentation

## 2022-07-23 DIAGNOSIS — F17210 Nicotine dependence, cigarettes, uncomplicated: Secondary | ICD-10-CM | POA: Diagnosis not present

## 2022-07-23 DIAGNOSIS — X501XXA Overexertion from prolonged static or awkward postures, initial encounter: Secondary | ICD-10-CM | POA: Diagnosis not present

## 2022-07-23 MED ORDER — KETOROLAC TROMETHAMINE 30 MG/ML IJ SOLN
30.0000 mg | Freq: Once | INTRAMUSCULAR | Status: AC
  Administered 2022-07-23: 30 mg via INTRAMUSCULAR
  Filled 2022-07-23: qty 1

## 2022-07-23 NOTE — Discharge Instructions (Signed)
Your imaging today was normal without any acute findings.  I would take Tylenol Motrin for your pain going forward.  If you are still having pain after about 2 weeks, follow-up with your PCP to determine if any additional evaluation is needed.

## 2022-07-23 NOTE — ED Triage Notes (Signed)
Pt to the ED with complaints of back pain that began a week ago. Pt states he felt like something tore when he coughed this morning.
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
## 2022-11-07 ENCOUNTER — Encounter: Payer: Self-pay | Admitting: Family Medicine

## 2022-11-07 ENCOUNTER — Encounter: Admitting: Family Medicine

## 2022-11-07 DIAGNOSIS — Z0000 Encounter for general adult medical examination without abnormal findings: Secondary | ICD-10-CM

## 2023-04-05 IMAGING — CT CT HEAD W/O CM
3 series · 14 of 47 positions shown, 16 images · non-contrast
Comparison: No pertinent prior exams available for comparison.

CLINICAL DATA: Provided history: New daily persistent headache.
Headache, new or worsening, neuro deficit. Additional history
provided by scanning technologist: Patient reports intermittent
headaches, onset over 1 year ago, radiating from neck to left
temporal scalp, pressure with shooting pain, associated photophobia.

EXAM:
CT HEAD WITHOUT CONTRAST
TECHNIQUE: Contiguous axial images were obtained from the base of the skull
through the vertex without intravenous contrast.

[Series 2: head w o · axial · 0.49mm/px · z∈[+14,+154]mm · 8 of 34 slices shown, 10 images]
[im 3/34  brain]
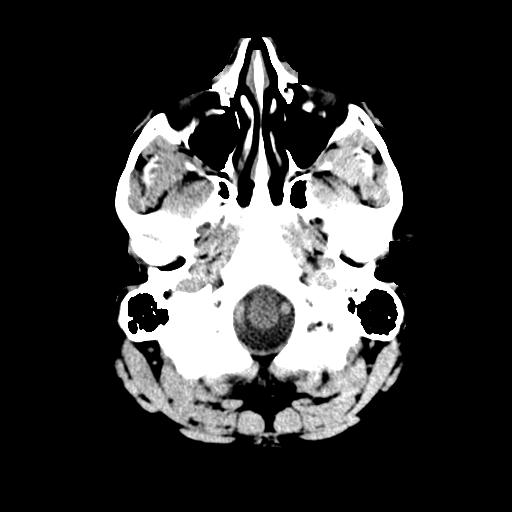
[im 3/34  bone]
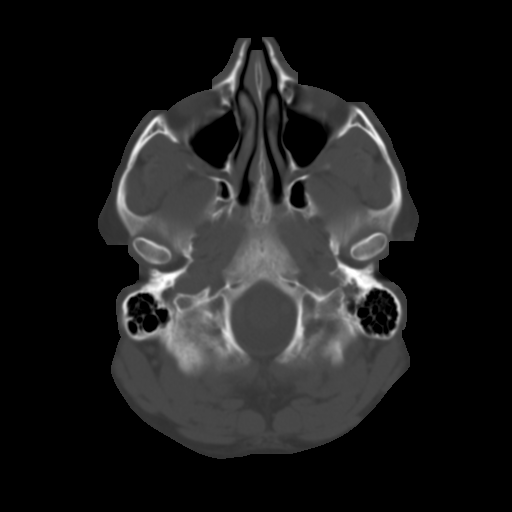
[im 7/34  brain]
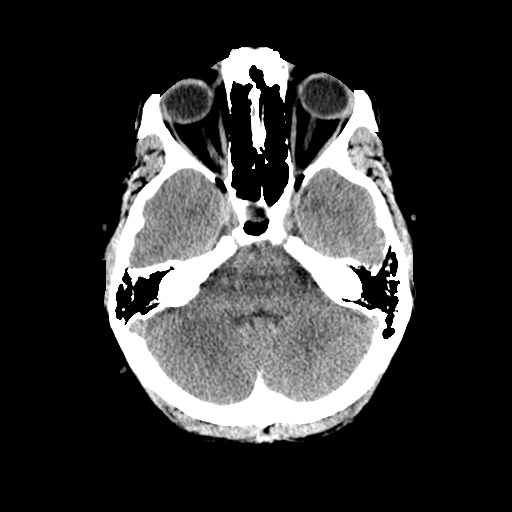
[im 11/34  brain]
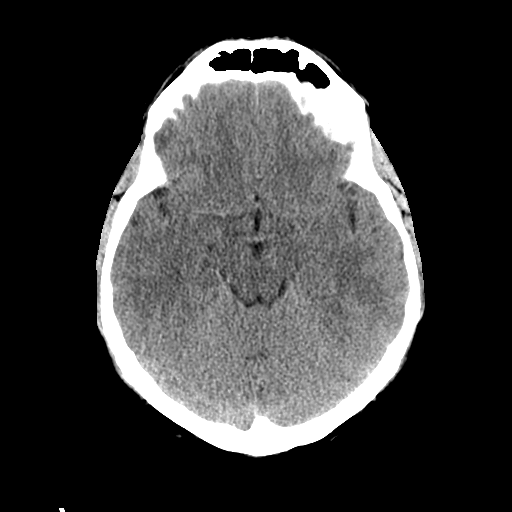
[im 15/34  brain]
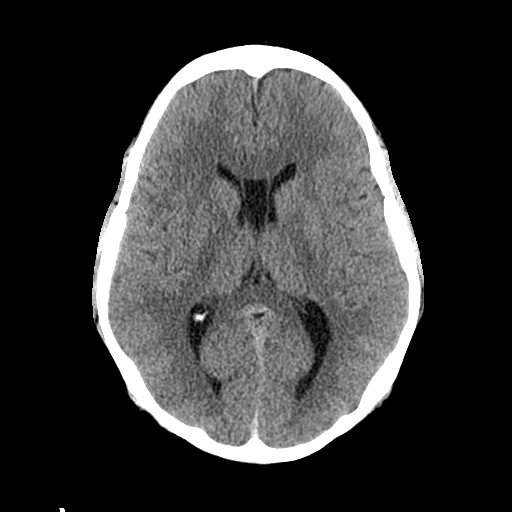
[im 19/34  brain]
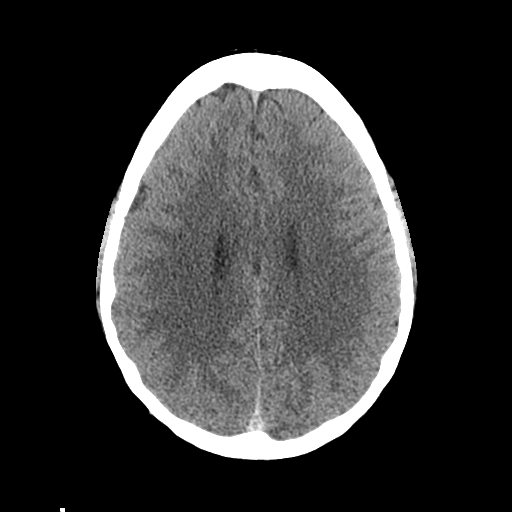
[im 19/34  bone]
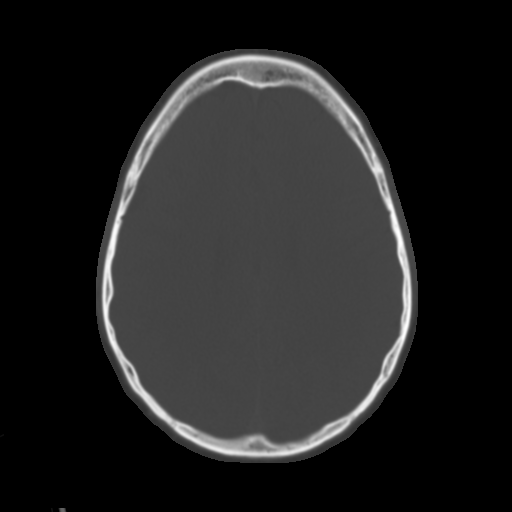
[im 23/34  brain]
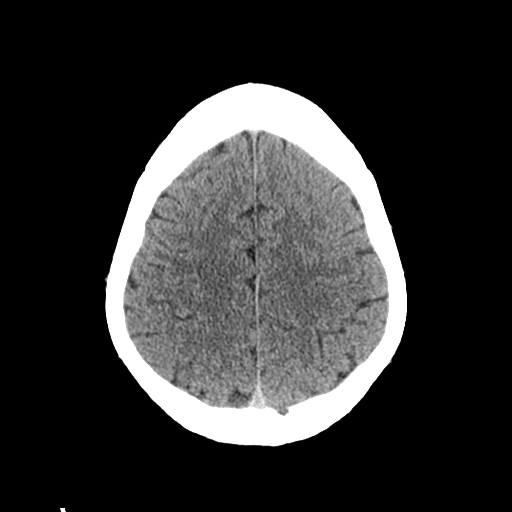
[im 27/34  brain]
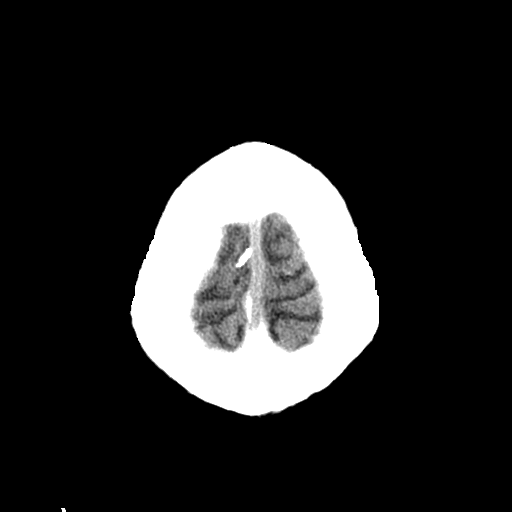
[im 31/34  brain]
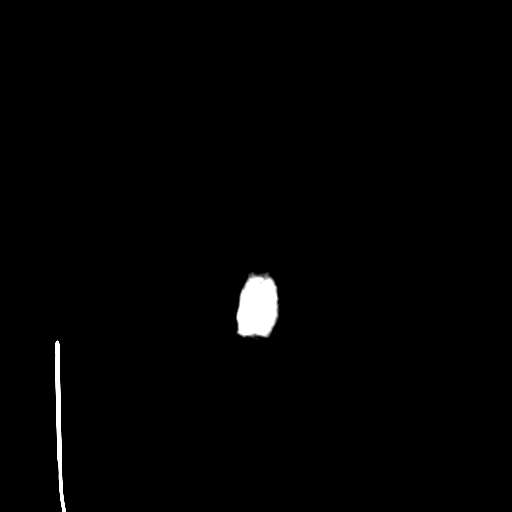

[Series 4: coronal soft · coronal · 0.33mm/px · 3 of 77 slices shown]
[im 26/77  brain]
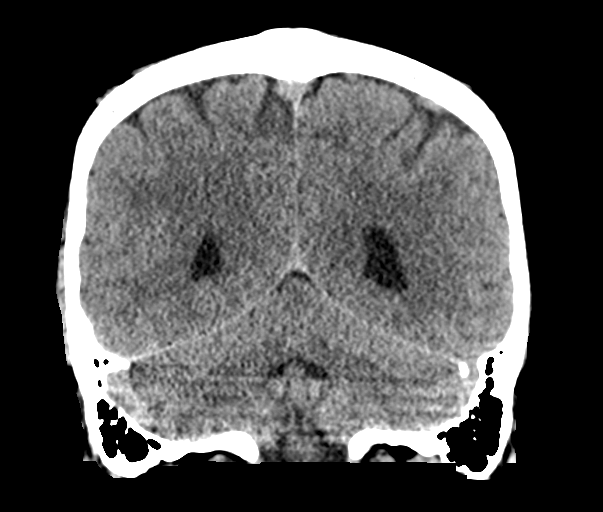
[im 34/77  brain]
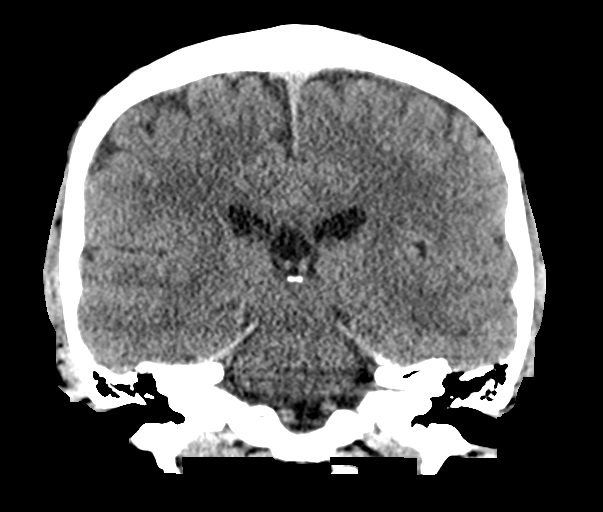
[im 43/77  brain]
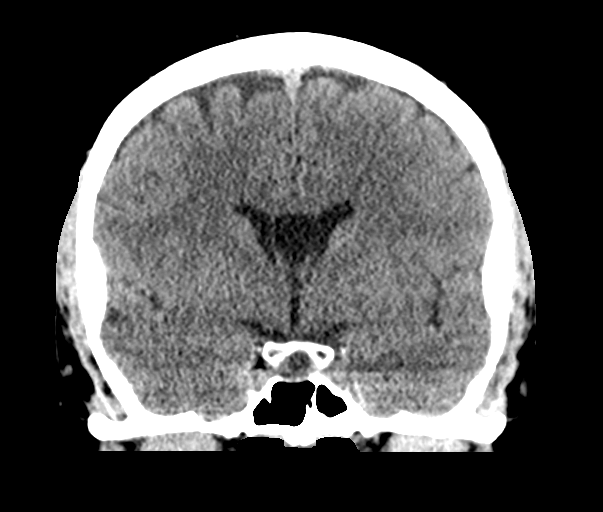

[Series 5: sagittal soft · sagittal · 0.33mm/px · 3 of 67 slices shown]
[im 23/67  brain]
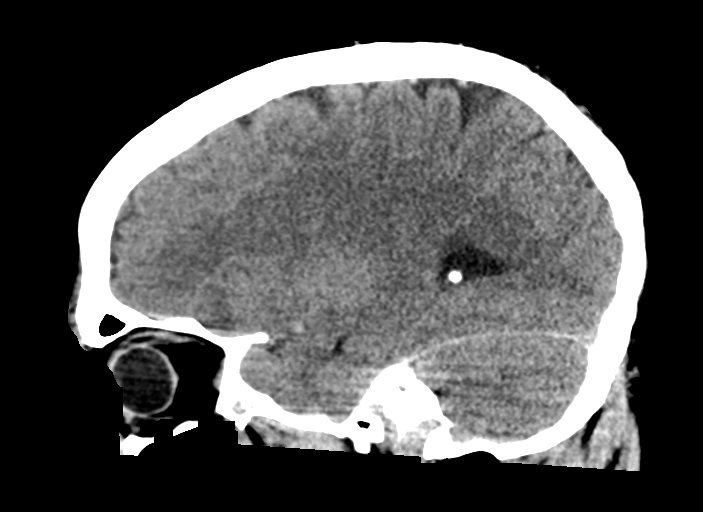
[im 34/67  brain]
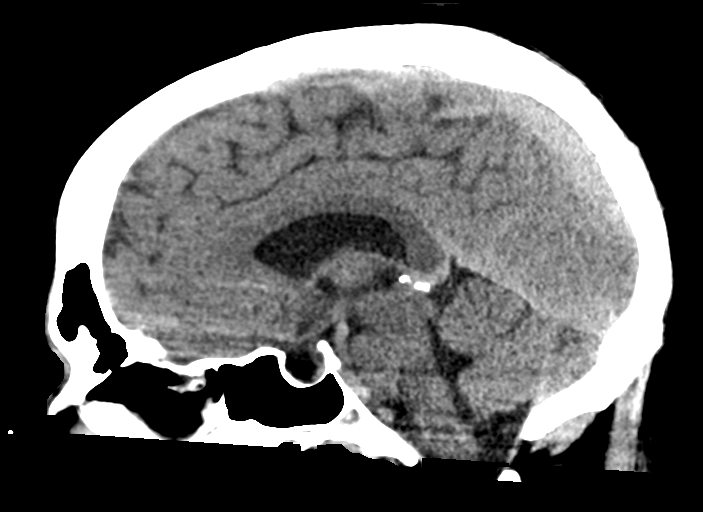
[im 45/67  brain]
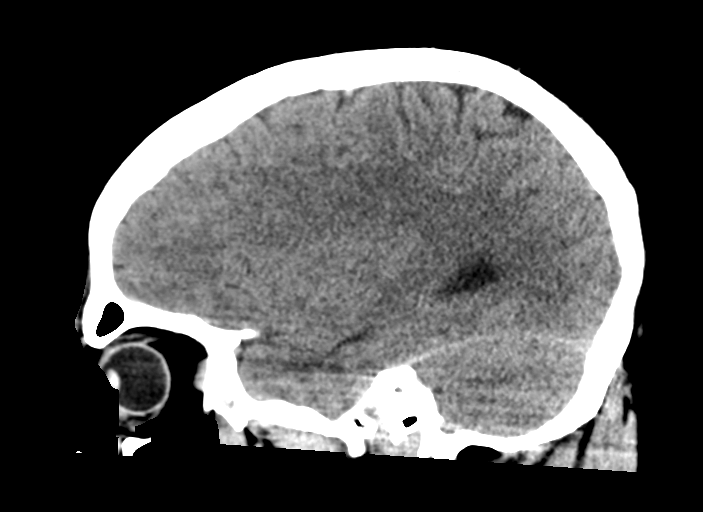

[14 of 47 positions shown; findings below may reference images not displayed]

FINDINGS: Brain:

Cerebral volume is normal.

Incidentally noted cavum septum pellucidum and cavum vergae
(anatomic variant).

"Empty" appearance of the sella turcica.

There is no acute intracranial hemorrhage.

No demarcated cortical infarct.

No extra-axial fluid collection.

No evidence of an intracranial mass.

No midline shift.

Vascular: No hyperdense vessel.

Skull: Normal. Negative for fracture or focal lesion.

Sinuses/Orbits: Visualized orbits show no acute finding. Minimal
mucosal thickening within the left frontal and right ethmoid sinuses
at the imaged levels.
IMPRESSION: "Empty" appearance of the sella turcica. This is a nonspecific
finding, sometimes associated with idiopathic intracranial
hypertension (pseudotumor cerebri). However, this condition is rare
in males.

Otherwise unremarkable non-contrast CT appearance of the brain.

Minimal paranasal sinus mucosal thickening at the imaged levels.

## 2023-04-24 ENCOUNTER — Telehealth: Payer: Self-pay | Admitting: Neurology

## 2023-04-24 DIAGNOSIS — G4733 Obstructive sleep apnea (adult) (pediatric): Secondary | ICD-10-CM

## 2023-04-24 NOTE — Telephone Encounter (Signed)
Pulled recent download below. Pt last saw AL,NP 07/03/22 and has yearly follow up with Amy Lomax,NP 07/04/23. I called pt to get more information as to why he wants to change masks.  LVM for pt to call office.

## 2023-04-24 NOTE — Telephone Encounter (Signed)
Pt last saw Amy Lomax

## 2023-04-24 NOTE — Telephone Encounter (Signed)
Pt called stating that he is needing an Rx sent to his DME in order for him to be able to change from a face mask to a Nasal cushion. Please advise.

## 2023-04-25 NOTE — Telephone Encounter (Signed)
Community message sent to [employer] that order placed. I called pt and informed him. He verbalized understanding and appreciation.

## 2023-04-25 NOTE — Telephone Encounter (Signed)
Dr. Frances Furbish- do you mind reviewing since Dr. Vickey Huger and AL,NP out? Thank you  Called and spoke w/ pt. He had sleep study 03/2022. Dr. Vickey Huger documented: "Interface of patient's choice ( with this level of snoring it will be a FFM , most likely) " I relayed this to pt and he states he actually has been using his wife's nasal pillow mask for the last three months and tolerating well. Wants rx sent to DME: [employer] for this mask. I did relay data looks good. Aware Dr. Vickey Huger out. I will send to Dr. Frances Furbish who is our other sleep doctor to see if she can approve switch from [facility] to nasal pillow and will call back.

## 2023-04-25 NOTE — Telephone Encounter (Signed)
Order placed for nasal pillows interface.

## 2023-05-09 ENCOUNTER — Telehealth: Payer: Self-pay

## 2023-05-09 NOTE — Transitions of Care (Post Inpatient/ED Visit) (Signed)
   05/09/2023  Name: Tanner Johnson MRN: [id number] DOB: [birth year]  Today's TOC FU Call Status: Today's TOC FU Call Status:: Successful TOC FU Call Competed TOC FU Call Complete Date: 05/09/23  Transition Care Management Follow-up Telephone Call Date of Discharge: 05/08/23 Discharge Facility: Other ([facility]) Name of Other ([facility]) Discharge Facility: [facility] Type of Discharge: Emergency Department Reason for ED Visit: Other: (pneumonia) How have you been since you were released from the hospital?: Better Any questions or concerns?: No  Items Reviewed: Did you receive and understand the discharge instructions provided?: Yes Medications obtained,verified, and reconciled?: Yes (Medications Reviewed) Any new allergies since your discharge?: No Dietary orders reviewed?: Yes Do you have support at home?: Yes People in Home: spouse  Medications Reviewed Today: Medications Reviewed Today     Reviewed by Karena Addison, LPN (Licensed Practical Nurse) on 05/09/23 at 1157  Med List Status: <None>   Medication Order Taking? Sig Documenting Provider Last Dose Status Informant  acetaminophen (TYLENOL) 500 MG tablet [id number]  Take 1,000 mg by mouth as needed. Back pn [provider]  Active   azithromycin (ZITHROMAX) 250 MG tablet [id number] Yes Take 250 mg by mouth once. [provider]  Active   benzonatate (TESSALON) 100 MG capsule [id number] Yes Take 100 mg by mouth 2 (two) times daily. [provider]  Active   doxycycline (VIBRAMYCIN) 100 MG capsule [id number] Yes Take 100 mg by mouth daily. [provider]  Active             Home Care and Equipment/Supplies: Were Home Health Services Ordered?: NA Any new equipment or medical supplies ordered?: NA  Functional Questionnaire: Do you need assistance with bathing/showering or dressing?: No Do you need assistance with meal preparation?: No Do you need assistance with eating?:  No Do you have difficulty maintaining continence: No Do you need assistance with getting out of bed/getting out of a chair/moving?: No Do you have difficulty managing or taking your medications?: No  Follow up appointments reviewed: PCP Follow-up appointment confirmed?: No (no avail appt times, sent message to staff to schedule) MD Provider Line Number:[phone] Given: No Specialist Hospital Follow-up appointment confirmed?: NA Do you need transportation to your follow-up appointment?: No Do you understand care options if your condition(s) worsen?: Yes-patient verbalized understanding    SIGNATURE Karena Addison, LPN [facility] Nurse Health Advisor Direct Dial [phone]

## 2023-05-13 ENCOUNTER — Encounter: Admitting: Family Medicine

## 2023-05-13 ENCOUNTER — Encounter: Payer: Self-pay | Admitting: Family Medicine

## 2023-06-18 ENCOUNTER — Encounter: Payer: Self-pay | Admitting: Family Medicine

## 2023-06-18 ENCOUNTER — Telehealth: Payer: Self-pay | Admitting: Family Medicine

## 2023-06-18 NOTE — Telephone Encounter (Signed)
LVM and sent letter in mail informing pt of need to reschedule 07/04/23 appt - NP out

## 2023-07-04 ENCOUNTER — Telehealth: Admitting: Family Medicine

## 2024-01-14 ENCOUNTER — Encounter: Payer: Self-pay | Admitting: Family Medicine

## 2024-01-14 ENCOUNTER — Ambulatory Visit (INDEPENDENT_AMBULATORY_CARE_PROVIDER_SITE_OTHER): Admitting: Family Medicine

## 2024-01-14 DIAGNOSIS — Z7985 Long-term (current) use of injectable non-insulin antidiabetic drugs: Secondary | ICD-10-CM

## 2024-01-14 DIAGNOSIS — Z23 Encounter for immunization: Secondary | ICD-10-CM | POA: Diagnosis not present

## 2024-01-14 DIAGNOSIS — E1165 Type 2 diabetes mellitus with hyperglycemia: Secondary | ICD-10-CM

## 2024-01-14 DIAGNOSIS — Z6841 Body Mass Index (BMI) 40.0 and over, adult: Secondary | ICD-10-CM | POA: Diagnosis not present

## 2024-01-14 DIAGNOSIS — E119 Type 2 diabetes mellitus without complications: Secondary | ICD-10-CM

## 2024-01-14 LAB — BAYER DCA HB A1C WAIVED: HB A1C (BAYER DCA - WAIVED): 12.5 % — ABNORMAL HIGH (ref 4.8–5.6)

## 2024-01-14 MED ORDER — BLOOD GLUCOSE TEST VI STRP: 1.0000 | ORAL_STRIP | Freq: Three times a day (TID) | 0 refills | Status: AC

## 2024-01-14 MED ORDER — LANCET DEVICE MISC
1.0000 | Freq: Three times a day (TID) | 0 refills | Status: AC
Start: 2024-01-14 — End: 2024-02-13

## 2024-01-14 MED ORDER — LANCETS MISC. MISC
1.0000 | Freq: Three times a day (TID) | 0 refills | Status: AC
Start: 2024-01-14 — End: 2024-02-13

## 2024-01-14 MED ORDER — BLOOD GLUCOSE MONITORING SUPPL DEVI: 1.0000 | Freq: Three times a day (TID) | 0 refills | Status: AC

## 2024-01-14 MED ORDER — METFORMIN HCL ER 500 MG PO TB24: 500.0000 mg | ORAL_TABLET | Freq: Two times a day (BID) | ORAL | 2 refills | Status: AC

## 2024-01-14 MED ORDER — OZEMPIC (0.25 OR 0.5 MG/DOSE) 2 MG/3ML ~~LOC~~ SOPN: 0.5000 mg | PEN_INJECTOR | SUBCUTANEOUS | 1 refills | Status: AC

## 2024-01-14 NOTE — Patient Instructions (Signed)

## 2024-01-14 NOTE — Progress Notes (Unsigned)
 Subjective:  Patient ID: Tanner Johnson, male    DOB: 04/25/1986  Age: 38 y.o. MRN: [id number]  CC: Prediabetes (Would like to check for prediabetes/Family hx and on week of frequent urination at night and constant dry mouth)   HPI BLESSED COTHAM presents for one week of polydipsia, polyuria, frequency. Craving fruit. Appeite.stable. Nocturia every 1-1.5 hours. Weight down from almost 300 lb 3 mos ago to 261 today. Not trying to lose weight. No nausea. Fhx positive for DM. Wife is diabetic.      05/02/2022    8:34 AM 11/01/2021   10:26 AM 11/01/2021   10:22 AM  Depression screen PHQ 2/9  Decreased Interest 0 1 0  Down, Depressed, Hopeless 0 1 0  PHQ - 2 Score 0 2 0  Altered sleeping  1   Tired, decreased energy  1   Change in appetite  1   Feeling bad or failure about yourself   0   Trouble concentrating  0   Moving slowly or fidgety/restless  1   Suicidal thoughts  0   PHQ-9 Score  6   Difficult doing work/chores  Somewhat difficult     History Juell has a past medical history of Anxiety and OCD (obsessive compulsive disorder).   He has a past surgical history that includes Back surgery.   His family history includes Hyperlipidemia in his father; Hypertension in his father.He reports that he has been smoking cigarettes. He has a 7.5 pack-year smoking history. He does not have any smokeless tobacco history on file. He reports that he does not currently use alcohol. He reports that he does not use drugs.    ROS Review of Systems  Constitutional:  Negative for fever.  Respiratory:  Negative for shortness of breath.   Cardiovascular:  Negative for chest pain.  Endocrine: Positive for polydipsia and polyuria.  Musculoskeletal:  Negative for arthralgias.  Skin:  Negative for rash.    Objective:  BP 125/70   Pulse 84   Temp 98.1 F (36.7 C)   Ht 5\' 7"  (1.702 m)   Wt 261 lb (118.4 kg)   SpO2 98%   BMI 40.88 kg/m   BP Readings from Last 3 Encounters:  01/14/24  125/70  07/23/22 (!) 145/109  07/03/22 (!) 150/102    Wt Readings from Last 3 Encounters:  01/14/24 261 lb (118.4 kg)  07/23/22 280 lb (127 kg)  07/03/22 285 lb (129.3 kg)     Physical Exam Vitals reviewed.  Constitutional:      Appearance: He is well-developed.  HENT:     Head: Normocephalic and atraumatic.     Right Ear: External ear normal.     Left Ear: External ear normal.     Mouth/Throat:     Pharynx: No oropharyngeal exudate or posterior oropharyngeal erythema.  Eyes:     Pupils: Pupils are equal, round, and reactive to light.  Cardiovascular:     Rate and Rhythm: Normal rate and regular rhythm.     Heart sounds: No murmur heard. Pulmonary:     Effort: No respiratory distress.     Breath sounds: Normal breath sounds.  Musculoskeletal:     Cervical back: Normal range of motion and neck supple.  Neurological:     Mental Status: He is alert and oriented to person, place, and time.       Assessment & Plan:   Shedrick was seen today for prediabetes.  Diagnoses and all orders for this visit:  Morbid obesity (HCC) -     Bayer DCA Hb A1c Waived  Immunization due -     Tdap vaccine greater than or equal to 7yo IM  Type 2 diabetes mellitus with hyperglycemia, without long-term current use of insulin (HCC) -     Cancel: CMP14+EGFR -     CBC with Differential/Platelet -     CMP14+EGFR  Diabetes mellitus treated with injections of non-insulin medication (HCC) -     Cancel: CMP14+EGFR -     CBC with Differential/Platelet -     CMP14+EGFR  Other orders -     Semaglutide,0.25 or 0.5MG /DOS, (OZEMPIC, 0.25 OR 0.5 MG/DOSE,) 2 MG/3ML SOPN; Inject 0.5 mg into the skin once a week. -     Blood Glucose Monitoring Suppl DEVI; 1 each by Does not apply route in the morning, at noon, and at bedtime. May substitute to any manufacturer covered by patient's insurance. -     Glucose Blood (BLOOD GLUCOSE TEST STRIPS) STRP; 1 each by In Vitro route in the morning, at noon, and at  bedtime. May substitute to any manufacturer covered by patient's insurance. -     Lancet Device MISC; 1 each by Does not apply route in the morning, at noon, and at bedtime. May substitute to any manufacturer covered by patient's insurance. -     Lancets Misc. MISC; 1 each by Does not apply route in the morning, at noon, and at bedtime. May substitute to any manufacturer covered by patient's insurance. -     metFORMIN (GLUCOPHAGE-XR) 500 MG 24 hr tablet; Take 1 tablet (500 mg total) by mouth 2 (two) times daily with a meal.       I am having Estanislado Emms start on Ozempic (0.25 or 0.5 MG/DOSE), Blood Glucose Monitoring Suppl, BLOOD GLUCOSE TEST STRIPS, Lancet Device, Lancets Misc., and metFORMIN. I am also having him maintain his acetaminophen, azithromycin, benzonatate, and doxycycline.  Allergies as of 01/14/2024       Reactions   Sulfa Antibiotics Hives        Medication List        Accurate as of January 14, 2024 11:59 PM. If you have any questions, ask your nurse or doctor.          azithromycin 250 MG tablet Commonly known as: ZITHROMAX Take 250 mg by mouth once.   benzonatate 100 MG capsule Commonly known as: TESSALON Take 100 mg by mouth 2 (two) times daily.   Blood Glucose Monitoring Suppl Devi 1 each by Does not apply route in the morning, at noon, and at bedtime. May substitute to any manufacturer covered by patient's insurance. Started by: Austine Wiedeman   BLOOD GLUCOSE TEST STRIPS Strp 1 each by In Vitro route in the morning, at noon, and at bedtime. May substitute to any manufacturer covered by patient's insurance. Started by: Lyrical Sowle   doxycycline 100 MG capsule Commonly known as: VIBRAMYCIN Take 100 mg by mouth daily.   Lancet Device Misc 1 each by Does not apply route in the morning, at noon, and at bedtime. May substitute to any manufacturer covered by patient's insurance. Started by: Jachin Coury   [employer] Misc 1 each by Does not  apply route in the morning, at noon, and at bedtime. May substitute to any manufacturer covered by patient's insurance. Started by: Tama Grosz   metFORMIN 500 MG 24 hr tablet Commonly known as: GLUCOPHAGE-XR Take 1 tablet (500 mg total) by mouth 2 (two) times daily with  a meal. Started by: Dhruvi Crenshaw   Ozempic (0.25 or 0.5 MG/DOSE) 2 MG/3ML Sopn Generic drug: Semaglutide(0.25 or 0.5MG /DOS) Inject 0.5 mg into the skin once a week. Started by: Desera Graffeo   TYLENOL 500 MG tablet Generic drug: acetaminophen Take 1,000 mg by mouth as needed. Back pn        45 min spent with pt. Greater than 1/2 in diabetic education, diet monitoring, exercise and weight loss. Follow-up: Return in about 2 weeks (around 01/28/2024).  Mechele Claude, M.D.

## 2024-01-15 ENCOUNTER — Encounter: Payer: Self-pay | Admitting: Family Medicine

## 2024-01-27 ENCOUNTER — Other Ambulatory Visit: Payer: Self-pay

## 2024-01-27 ENCOUNTER — Other Ambulatory Visit (HOSPITAL_COMMUNITY): Payer: Self-pay

## 2024-01-27 ENCOUNTER — Ambulatory Visit

## 2024-01-27 ENCOUNTER — Telehealth: Payer: Self-pay

## 2024-01-27 NOTE — Telephone Encounter (Signed)
 Pharmacy Patient Advocate Encounter   Received notification from CoverMyMeds that prior authorization for Ozempic (0.25 or 0.5 MG/DOSE) 2MG /3ML pen-injectors is required/requested.   Insurance verification completed.   The patient is insured through [employer] .   Per test claim: PA required; PA submitted to above mentioned insurance via CoverMyMeds Key/confirmation #/EOC [id number] Status is pending

## 2024-01-29 ENCOUNTER — Other Ambulatory Visit (HOSPITAL_COMMUNITY): Payer: Self-pay

## 2024-01-29 NOTE — Telephone Encounter (Signed)
 Pharmacy Patient Advocate Encounter  Received notification from [employer] that Prior Authorization for Ozempic (0.25 or 0.5 MG/DOSE) 2MG /3ML pen-injectors has been APPROVED from 12/28/23 to 11/18/2098. Unable to obtain price due to refill too soon rejection, last fill date 01/27/24 next available fill date03/30/25   PA #/Case ID/Reference #: [id number]

## 2024-03-17 ENCOUNTER — Encounter: Payer: Self-pay | Admitting: *Deleted
